# Patient Record
Sex: Male | Born: 1960 | Race: White | Hispanic: No | Marital: Married | Smoking: Current every day smoker
Health system: Southern US, Community
[De-identification: ages and names within clinical notes are randomized; demographics above are authoritative.]

## PROBLEM LIST (undated history)

## (undated) DIAGNOSIS — Z720 Tobacco use: Secondary | ICD-10-CM

## (undated) DIAGNOSIS — I10 Essential (primary) hypertension: Secondary | ICD-10-CM

## (undated) DIAGNOSIS — F1090 Alcohol use, unspecified, uncomplicated: Secondary | ICD-10-CM

## (undated) DIAGNOSIS — I2510 Atherosclerotic heart disease of native coronary artery without angina pectoris: Secondary | ICD-10-CM

## (undated) DIAGNOSIS — R0600 Dyspnea, unspecified: Secondary | ICD-10-CM

## (undated) DIAGNOSIS — Z789 Other specified health status: Secondary | ICD-10-CM

## (undated) DIAGNOSIS — Z87442 Personal history of urinary calculi: Secondary | ICD-10-CM

## (undated) DIAGNOSIS — R7303 Prediabetes: Secondary | ICD-10-CM

## (undated) DIAGNOSIS — Z9289 Personal history of other medical treatment: Secondary | ICD-10-CM

## (undated) DIAGNOSIS — Z7289 Other problems related to lifestyle: Secondary | ICD-10-CM

## (undated) HISTORY — PX: TONSILLECTOMY: SUR1361

---

## 2014-03-11 ENCOUNTER — Emergency Department (HOSPITAL_BASED_OUTPATIENT_CLINIC_OR_DEPARTMENT_OTHER)
Admission: EM | Admit: 2014-03-11 | Discharge: 2014-03-11 | Disposition: A | Discharge status: Home / Self Care | Payer: 59 | Attending: Emergency Medicine | Admitting: Emergency Medicine

## 2014-03-11 ENCOUNTER — Emergency Department (HOSPITAL_BASED_OUTPATIENT_CLINIC_OR_DEPARTMENT_OTHER): Payer: 59

## 2014-03-11 ENCOUNTER — Encounter (HOSPITAL_BASED_OUTPATIENT_CLINIC_OR_DEPARTMENT_OTHER): Payer: Self-pay | Admitting: Emergency Medicine

## 2014-03-11 DIAGNOSIS — F17200 Nicotine dependence, unspecified, uncomplicated: Secondary | ICD-10-CM | POA: Insufficient documentation

## 2014-03-11 DIAGNOSIS — R079 Chest pain, unspecified: Secondary | ICD-10-CM

## 2014-03-11 DIAGNOSIS — K219 Gastro-esophageal reflux disease without esophagitis: Secondary | ICD-10-CM | POA: Insufficient documentation

## 2014-03-11 DIAGNOSIS — IMO0001 Reserved for inherently not codable concepts without codable children: Secondary | ICD-10-CM

## 2014-03-11 DIAGNOSIS — R0602 Shortness of breath: Secondary | ICD-10-CM | POA: Insufficient documentation

## 2014-03-11 DIAGNOSIS — I10 Essential (primary) hypertension: Secondary | ICD-10-CM | POA: Insufficient documentation

## 2014-03-11 HISTORY — DX: Essential (primary) hypertension: I10

## 2014-03-11 LAB — CBC
HCT: 46.1 % (ref 39.0–52.0)
HEMOGLOBIN: 16.2 g/dL (ref 13.0–17.0)
MCH: 35.4 pg — ABNORMAL HIGH (ref 26.0–34.0)
MCHC: 35.1 g/dL (ref 30.0–36.0)
MCV: 100.9 fL — AB (ref 78.0–100.0)
Platelets: 291 10*3/uL (ref 150–400)
RBC: 4.57 MIL/uL (ref 4.22–5.81)
RDW: 13.2 % (ref 11.5–15.5)
WBC: 13.6 10*3/uL — AB (ref 4.0–10.5)

## 2014-03-11 LAB — D-DIMER, QUANTITATIVE (NOT AT ARMC)

## 2014-03-11 LAB — COMPREHENSIVE METABOLIC PANEL
ALK PHOS: 181 U/L — AB (ref 39–117)
ALT: 121 U/L — ABNORMAL HIGH (ref 0–53)
AST: 107 U/L — ABNORMAL HIGH (ref 0–37)
Albumin: 3.9 g/dL (ref 3.5–5.2)
BILIRUBIN TOTAL: 0.6 mg/dL (ref 0.3–1.2)
BUN: 10 mg/dL (ref 6–23)
CHLORIDE: 102 meq/L (ref 96–112)
CO2: 24 meq/L (ref 19–32)
CREATININE: 1 mg/dL (ref 0.50–1.35)
Calcium: 10 mg/dL (ref 8.4–10.5)
GFR, EST NON AFRICAN AMERICAN: 86 mL/min — AB (ref >90)
GLUCOSE: 131 mg/dL — AB (ref 70–99)
POTASSIUM: 4.1 meq/L (ref 3.7–5.3)
Sodium: 142 mEq/L (ref 137–147)
Total Protein: 7.9 g/dL (ref 6.0–8.3)

## 2014-03-11 LAB — PRO B NATRIURETIC PEPTIDE: Pro B Natriuretic peptide (BNP): 35.3 pg/mL (ref 0–125)

## 2014-03-11 LAB — TROPONIN I

## 2014-03-11 MED ORDER — AMLODIPINE BESYLATE 10 MG PO TABS
10.0000 mg | ORAL_TABLET | Freq: Every day | ORAL | Status: DC
Start: 2014-03-11 — End: 2018-11-15

## 2014-03-11 MED ORDER — LANSOPRAZOLE 30 MG PO CPDR: 30.0000 mg | DELAYED_RELEASE_CAPSULE | Freq: Every day | ORAL | Status: DC

## 2014-03-11 NOTE — Discharge Instructions (Signed)
Chest Pain (Nonspecific) °It is often hard to give a specific diagnosis for the cause of chest pain. There is always a chance that your pain could be related to something serious, such as a heart attack or a blood clot in the lungs. You need to follow up with your caregiver for further evaluation. °CAUSES  °· Heartburn. °· Pneumonia or bronchitis. °· Anxiety or stress. °· Inflammation around your heart (pericarditis) or lung (pleuritis or pleurisy). °· A blood clot in the lung. °· A collapsed lung (pneumothorax). It can develop suddenly on its own (spontaneous pneumothorax) or from injury (trauma) to the chest. °· Shingles infection (herpes zoster virus). °The chest wall is composed of bones, muscles, and cartilage. Any of these can be the source of the pain. °· The bones can be bruised by injury. °· The muscles or cartilage can be strained by coughing or overwork. °· The cartilage can be affected by inflammation and become sore (costochondritis). °DIAGNOSIS  °Lab tests or other studies, such as X-rays, electrocardiography, stress testing, or cardiac imaging, may be needed to find the cause of your pain.  °TREATMENT  °· Treatment depends on what may be causing your chest pain. Treatment may include: °· Acid blockers for heartburn. °· Anti-inflammatory medicine. °· Pain medicine for inflammatory conditions. °· Antibiotics if an infection is present. °· You may be advised to change lifestyle habits. This includes stopping smoking and avoiding alcohol, caffeine, and chocolate. °· You may be advised to keep your head raised (elevated) when sleeping. This reduces the chance of acid going backward from your stomach into your esophagus. °· Most of the time, nonspecific chest pain will improve within 2 to 3 days with rest and mild pain medicine. °HOME CARE INSTRUCTIONS  °· If antibiotics were prescribed, take your antibiotics as directed. Finish them even if you start to feel better. °· For the next few days, avoid physical  activities that bring on chest pain. Continue physical activities as directed. °· Do not smoke. °· Avoid drinking alcohol. °· Only take over-the-counter or prescription medicine for pain, discomfort, or fever as directed by your caregiver. °· Follow your caregiver's suggestions for further testing if your chest pain does not go away. °· Keep any follow-up appointments you made. If you do not go to an appointment, you could develop lasting (chronic) problems with pain. If there is any problem keeping an appointment, you must call to reschedule. °SEEK MEDICAL CARE IF:  °· You think you are having problems from the medicine you are taking. Read your medicine instructions carefully. °· Your chest pain does not go away, even after treatment. °· You develop a rash with blisters on your chest. °SEEK IMMEDIATE MEDICAL CARE IF:  °· You have increased chest pain or pain that spreads to your arm, neck, jaw, back, or abdomen. °· You develop shortness of breath, an increasing cough, or you are coughing up blood. °· You have severe back or abdominal pain, feel nauseous, or vomit. °· You develop severe weakness, fainting, or chills. °· You have a fever. °THIS IS AN EMERGENCY. Do not wait to see if the pain will go away. Get medical help at once. Call your local emergency services (911 in [place] Do not drive yourself to the hospital. °MAKE SURE YOU:  °· Understand these instructions. °· Will watch your condition. °· Will get help right away if you are not doing well or get worse. °Document Released: 07/24/2005 Document Revised: 01/06/2012 Document Reviewed: 05/19/2008 °ExitCare® Patient Information ©2014 [employer],   LLC. ° °Gastroesophageal Reflux Disease, Adult °Gastroesophageal reflux disease (GERD) happens when acid from your stomach flows up into the esophagus. When acid comes in contact with the esophagus, the acid causes soreness (inflammation) in the esophagus. Over time, GERD may create small holes (ulcers) in the lining of  the esophagus. °CAUSES  °· Increased body weight. This puts pressure on the stomach, making acid rise from the stomach into the esophagus. °· Smoking. This increases acid production in the stomach. °· Drinking alcohol. This causes decreased pressure in the lower esophageal sphincter (valve or ring of muscle between the esophagus and stomach), allowing acid from the stomach into the esophagus. °· Late evening meals and a full stomach. This increases pressure and acid production in the stomach. °· A malformed lower esophageal sphincter. °Sometimes, no cause is found. °SYMPTOMS  °· Burning pain in the lower part of the mid-chest behind the breastbone and in the mid-stomach area. This may occur twice a week or more often. °· Trouble swallowing. °· Sore throat. °· Dry cough. °· Asthma-like symptoms including chest tightness, shortness of breath, or wheezing. °DIAGNOSIS  °Your caregiver may be able to diagnose GERD based on your symptoms. In some cases, X-rays and other tests may be done to check for complications or to check the condition of your stomach and esophagus. °TREATMENT  °Your caregiver may recommend over-the-counter or prescription medicines to help decrease acid production. Ask your caregiver before starting or adding any new medicines.  °HOME CARE INSTRUCTIONS  °· Change the factors that you can control. Ask your caregiver for guidance concerning weight loss, quitting smoking, and alcohol consumption. °· Avoid foods and drinks that make your symptoms worse, such as: °· Caffeine or alcoholic drinks. °· Chocolate. °· Peppermint or mint flavorings. °· Garlic and onions. °· Spicy foods. °· Citrus fruits, such as oranges, lemons, or limes. °· Tomato-based foods such as sauce, chili, salsa, and pizza. °· Fried and fatty foods. °· Avoid lying down for the 3 hours prior to your bedtime or prior to taking a nap. °· Eat small, frequent meals instead of large meals. °· Wear loose-fitting clothing. Do not wear anything  tight around your waist that causes pressure on your stomach. °· Raise the head of your bed 6 to 8 inches with wood blocks to help you sleep. Extra pillows will not help. °· Only take over-the-counter or prescription medicines for pain, discomfort, or fever as directed by your caregiver. °· Do not take aspirin, ibuprofen, or other nonsteroidal anti-inflammatory drugs (NSAIDs). °SEEK IMMEDIATE MEDICAL CARE IF:  °· You have pain in your arms, neck, jaw, teeth, or back. °· Your pain increases or changes in intensity or duration. °· You develop nausea, vomiting, or sweating (diaphoresis). °· You develop shortness of breath, or you faint. °· Your vomit is green, yellow, black, or looks like coffee grounds or blood. °· Your stool is red, bloody, or black. °These symptoms could be signs of other problems, such as heart disease, gastric bleeding, or esophageal bleeding. °MAKE SURE YOU:  °· Understand these instructions. °· Will watch your condition. °· Will get help right away if you are not doing well or get worse. °Document Released: 07/24/2005 Document Revised: 01/06/2012 Document Reviewed: 05/03/2011 °ExitCare® Patient Information ©2014 ExitCare, LLC. °  Emergency Department Resource Guide °1) Find a Doctor and Pay Out of Pocket °Although you won't have to find out who is covered by your insurance plan, it is a good idea to ask around and get recommendations. You will   then need to call the office and see if the doctor you have chosen will accept you as a new patient and what types of options they offer for patients who are self-pay. Some doctors offer discounts or will set up payment plans for their patients who do not have insurance, but you will need to ask so you aren't surprised when you get to your appointment. ° °2) Contact Your Local Health Department °Not all health departments have doctors that can see patients for sick visits, but many do, so it is worth a call to see if yours does. If you don't know where  your local health department is, you can check in your phone book. The CDC also has a tool to help you locate your state's health department, and many state websites also have listings of all of their local health departments. ° °3) Find a Walk-in Clinic °If your illness is not likely to be very severe or complicated, you may want to try a walk in clinic. These are popping up all over the country in pharmacies, drugstores, and shopping centers. They're usually staffed by nurse practitioners or physician assistants that have been trained to treat common illnesses and complaints. They're usually fairly quick and inexpensive. However, if you have serious medical issues or chronic medical problems, these are probably not your best option. ° °No Primary Care Doctor: °- Call [facility] at  832-8000 - they can help you locate a primary care doctor that  accepts your insurance, provides certain services, etc. °- Physician Referral Service- 1-800-533-3463 ° °Chronic Pain Problems: °Organization         Address  Phone   Notes  °[facility]  (336) 297-2271 Patients need to be referred by their primary care doctor.  ° °Medication Assistance: °Organization         Address  Phone   Notes  °[place] [facility] [address] °[place], [place] [address] (336) 641-8030 --Must be a resident of [place] °-- Must have NO insurance coverage whatsoever (no Medicaid/ Medicare, etc.) °-- The pt. MUST have a primary care doctor that directs their care regularly and follows them in the community °  °[facility]  (866) 331-1348   °[employer]  (888) 892-1162   ° °Agencies that provide inexpensive medical care: °Organization         Address  Phone   Notes  °[facility]  (336) 832-8035   °[facility]    (336) 832-7272   °[facility] [address] °[place], [place] [address] (336) 832-4777   °[facility] [address], °[place] (336) 271-4999   °[facility]    (336) 373-0678   °[facility]    (336) 272-1050   °[facility] ° [address], [place] Phone:  (336) 832-4444, Fax:  (336) 832-4440 Hours of Operation:  9 am - 6 pm, M-F.  Also accepts Medicaid/Medicare and self-pay.  °[facility] ° [address], [place] Phone: (336) [phone], Fax: (336) [phone]. Hours of Operation:  8:30 am - 5:30 pm, M-F.  Also accepts Medicaid and self-pay.  °[facility] [place] [address], [place] Phone: (336) 878-6027   °[facility] [address], [place], [place] (336)723-1848, Ext. 123 Mondays & Thursdays: 7-9 AM.  First 15 patients are seen on a first come, first serve   basis. °  ° °Medicaid-accepting [place] Providers: ° °Organization         Address  Phone   Notes  °[facility] [address], [place] (336) [phone] Also accepts self-pay patients.  °[facility] [address], [place] ° (336) 856-9996   °[facility] [address], [place] (336) 288-8857   °[facility] [address] [place] [address], [place] (336) 299-7000   °Veita Bland [address], [place]  ° (336) [phone] Only accepts [place] Access Medicaid patients after they have their name applied to their card.  ° °Self-Pay (no insurance) in [place]: ° °Organization         Address  Phone   Notes  °Sickle Cell Patients, [facility] [address], [place] (336) 832-1970   °[facility] [address], [place] (336) 832-4400   °[facility] ° [address], [place] (336) 992-4800   °[facility]/Dr. Osei-Bonsu ° [address] [place] [address], [place] or [address], [place] (336) [phone] Phone number for both [place] and [place] locations is the same.  °[facility] [address], [place] (336) 299-0000   °[facility] [address] [place] [address], [place] or [address] (336) 852-7530 °(336) 878-2260   °[facility] [address], [place] (336) 350-1642, phone; (336) 294-5005, fax Sees patients 1st and 3rd Saturday of every month.  Must not qualify for public or private insurance (i.e. Medicaid, Medicare, [place] Health Choice, Veterans' Benefits) • Household income should be no more than 200% of the poverty level •The clinic cannot treat you if you are pregnant or think you are pregnant • Sexually transmitted diseases are not treated at the clinic.  ° °Dental Care: °Organization         Address  Phone  Notes  °[facility] [address], [place] (336) 641-6152 Accepts children up to age 21 who are enrolled in Medicaid or [place] Health Choice; pregnant women with a Medicaid card; and children who have applied for Medicaid or [place] Health Choice, but were declined, whose parents can pay a reduced fee at time of service.  °[facility] [place]  [address], [place] (336) 641-7733 Accepts children up to age 21 who are enrolled in Medicaid or [place] Health Choice; pregnant women with a Medicaid card; and children who have applied for Medicaid or [place] Health Choice, but were declined, whose parents can pay a reduced fee at time of service.  °[facility] ° [address],  (336) 641-4533 Patients are seen by appointment only. Walk-ins are not accepted. [facility] will see patients 18 years of age and older. °Monday - Tuesday (8am-5pm) °Most Wednesdays (8:30-5pm) °$30 per visit, cash only  °[facility] ° [address], [place] (336) 641-4533 Patients are seen by appointment only. Walk-ins are not  accepted. [facility] will see patients 18 years of age and older. °One Wednesday Evening (Monthly: Volunteer Based).  $30 per visit, cash only  °[facility]  (919) [phone] for adults; Children under age 4, call Graduate Pediatric Dentistry at ([phone]) [phone]. Children aged 4-14, please call ([phone]) [phone]   to request a pediatric application. ° Dental services are provided in all areas of dental care including fillings, crowns and bridges, complete and partial dentures, implants, gum treatment, root canals, and extractions. Preventive care is also provided. Treatment is provided to both adults and children. °Patients are selected via a lottery and there is often a waiting list. °  °[facility] [address], °[place] ° (336) 763-8833 [email or web address] °  °[facility] [address], [place], [place] (336)723-1848, Ext. 123 Second and Fourth Thursday of each month, opens at 6:30 AM; Clinic ends at 9 AM.  Patients are seen on a first-come first-served basis, and a limited number are seen during each clinic.  ° °[facility] ° [address], [place], [place] (336) [phone]   Eligibility Requirements °You must have lived in [place], [place], or [place] counties for at least the last three months. °  You cannot be eligible for state or federal sponsored healthcare insurance, including Veterans Administration, Medicaid, or Medicare. °  You generally cannot be eligible for healthcare insurance through your employer.  °  How to apply: °Eligibility screenings are held every Tuesday and Wednesday afternoon from 1:00 pm until 4:00 pm. You do not need an appointment for the interview!  °[facility] [address], [place], [place] 336-631-2330   °[facility]  336-342-8273   °[facility]  336-703-3100   °[facility]  336-570-6415   ° °Behavioral Health Resources in the  Community: °Intensive Outpatient Programs °Organization         Address  Phone  Notes  °[facility] [address], [place], [place] 336-878-6098   °[facility] [address], [place], [place] 336-832-9800   °[facility] [address], [place], [place] ° 336-882-2125   °[facility] [address],  °[place], [place] 1-800-853-5163 or 336-641-4981   °Substance Abuse Resources °Organization         Address  Phone  Notes  °[facility]  336-882-2125   °[facility]  336-784-9470   °[facility]  336-285-9073   °[facility]  336-845-3988   °Residential & Outpatient Substance Abuse Program  1-800-659-3381   °Psychological Services °Organization         Address  Phone  Notes  °[facility]  336- 832-9600   °[facility]  336- 378-7881   °[facility] [address], [place] 1-800-853-5163 or 336-641-4981   ° °Mobile Crisis Teams °Organization         Address  Phone  Notes  °[facility], Mobile Crisis Care Unit  1-877-626-1772   °Assertive °[facility] ° [address] [place], [place] 336-834-9664   °Sharon DeEsch [address] °[place] 336-554-5454   ° °Self-Help/Support Groups °Organization         Address  Phone             Notes  °[facility] - variety of support groups  336- 373-1402 Call for more information  °Narcotics Anonymous (NA), [facility] [address], °[place]  2 meetings at this location  ° °Residential Treatment Programs °Organization         Address  Phone  Notes  °[facility] [address],    °[place]  1-866-801-8205   °[facility] ° [address], [place], [place] 704-293-8524   °[facility] [address], [place] 336-845-3988 Admissions: 8am-3pm M-F  °[facility] [address]    °[place], [place] 336-841-1104   °[facility] [address], [place], [place] 336-379-7146   °[facility] [address]  °[place], [place] 336-285-9073   °[facility] - Intensive Outpatient [address], [place], [place] 336-852-3033   °[facility] [address]  °[place], [place] 1-877-615-2722 or 336-784-9470   °[facility] [address] [place], [place] 336-227-7417 Accepts Medicaid  °[facility] [address]  °[place] 1-800-659-3381 Substance Abuse/Addiction Treatment  ° °[place] Behavioral Health Resources °Organization         Address  Phone  Notes  °[facility]  (888) 581-9988   °Julie Brannon, PhD [address] [place], [place]   (336) 349-5553 or (336) 951-0000   °[facility]   [address] °[place], [place] (336) 349-4454   °[facility] [address], [place], [place] (336) 342-8316 Insurance/Medicaid/sponsorship through [facility]  °[facility] [address]                                    [place], [place] (336) 342-8316 Therapy/tele-psych/case  °[facility] [address]  ° [place],  (336) 349-2233    °Dr. Arfeen  (336) 349-4544   °[facility] [place]  [employer] [place] [facility] 1) [address], [place] °2) [address], [place] °3)  [address], [place] (336) 349-3220 °(336) 342-7768 ° °(336) 342-8140   °[place] [facility] (336) 342-1394 or (336) 342-3537 (After Hours)    ° °   °

## 2014-03-11 NOTE — ED Provider Notes (Signed)
CSN: [id number]     Arrival date & time 03/11/14  1743 History   First MD Initiated Contact with Patient 03/11/14 1807     Chief Complaint  Patient presents with  . Fatigue     (Consider location/radiation/quality/duration/timing/severity/associated sxs/prior Treatment) Patient is a 53 y.o. male presenting with chest pain. The history is provided by the patient. No language interpreter was used.  Chest Pain Pain location:  L chest Pain quality: aching   Pain radiates to:  L arm Pain radiates to the back: no   Pain severity:  Moderate Onset quality:  Gradual Duration:  3 days Timing:  Intermittent Progression:  Worsening Chronicity:  New Context: breathing   Relieved by:  Nothing Worsened by:  Nothing tried Ineffective treatments:  None tried Associated symptoms: shortness of breath     Past Medical History  Diagnosis Date  . Hypertension    Past Surgical History  Procedure Laterality Date  . Tonsillectomy     History reviewed. No pertinent family history. History  Substance Use Topics  . Smoking status: Current Every Day Smoker -- 1.00 packs/day    Types: Cigarettes  . Smokeless tobacco: Not on file  . Alcohol Use: No    Review of Systems  Respiratory: Positive for shortness of breath.   Cardiovascular: Positive for chest pain.  All other systems reviewed and are negative.     Allergies  Review of patient's allergies indicates no known allergies.  Home Medications   Prior to Admission medications   Not on File   BP 183/113  Pulse 105  Temp(Src) 98.3 F (36.8 C) (Oral)  Resp 18  Ht 5\' 9"  (1.753 m)  Wt 240 lb (108.863 kg)  BMI 35.43 kg/m2  SpO2 99% Physical Exam  Nursing note and vitals reviewed. Constitutional: He appears well-developed and well-nourished.  HENT:  Head: Normocephalic.  Eyes: Conjunctivae and EOM are normal. Pupils are equal, round, and reactive to light.  Neck: Normal range of motion. Neck supple.  Cardiovascular: Normal  rate and regular rhythm.   Pulmonary/Chest: Effort normal and breath sounds normal.  Abdominal: Soft. Bowel sounds are normal.  Neurological: He is alert.  Skin: Skin is warm.  Psychiatric: He has a normal mood and affect.    ED Course  Procedures (including critical care time) Labs Review Labs Reviewed  CBC  COMPREHENSIVE METABOLIC PANEL  TROPONIN I    Imaging Review No results found.   EKG Interpretation None      Results for orders placed during the hospital encounter of 03/11/14  CBC      Result Value Ref Range   WBC 13.6 (*) 4.0 - 10.5 K/uL   RBC 4.57  4.22 - 5.81 MIL/uL   Hemoglobin 16.2  13.0 - 17.0 g/dL   HCT 40.946.1  81.139.0 - 91.452.0 %   MCV 100.9 (*) 78.0 - 100.0 fL   MCH 35.4 (*) 26.0 - 34.0 pg   MCHC 35.1  30.0 - 36.0 g/dL   RDW 78.213.2  95.611.5 - 21.315.5 %   Platelets 291  150 - 400 K/uL  COMPREHENSIVE METABOLIC PANEL      Result Value Ref Range   Sodium 142  137 - 147 mEq/L   Potassium 4.1  3.7 - 5.3 mEq/L   Chloride 102  96 - 112 mEq/L   CO2 24  19 - 32 mEq/L   Glucose, Bld 131 (*) 70 - 99 mg/dL   BUN 10  6 - 23 mg/dL   Creatinine, Ser  1.00  0.50 - 1.35 mg/dL   Calcium 40.910.0  8.4 - 81.110.5 mg/dL   Total Protein 7.9  6.0 - 8.3 g/dL   Albumin 3.9  3.5 - 5.2 g/dL   AST 914107 (*) 0 - 37 U/L   ALT 121 (*) 0 - 53 U/L   Alkaline Phosphatase 181 (*) 39 - 117 U/L   Total Bilirubin 0.6  0.3 - 1.2 mg/dL   GFR calc non Af Amer 86 (*) >90 mL/min   GFR calc Af Amer >90  >90 mL/min  TROPONIN I      Result Value Ref Range   Troponin I <0.30  <0.30 ng/mL  D-DIMER, QUANTITATIVE      Result Value Ref Range   D-Dimer, Quant <0.27  0.00 - 0.48 ug/mL-FEU  PRO B NATRIURETIC PEPTIDE      Result Value Ref Range   Pro B Natriuretic peptide (BNP) 35.3  0 - 125 pg/mL   Dg Chest 2 View  03/11/2014   CLINICAL DATA:  Chest pain and shortness of breath with generalize weakness  EXAM: CHEST  2 VIEW  COMPARISON:  None.  FINDINGS: The lungs are adequately inflated. There is mild prominence of  the pulmonary interstitial markings especially in the perihilar regions. The cardiac silhouette is normal in size. The mediastinum is normal in width. The pulmonary vascularity is prominent centrally. There is no pleural effusion or pneumothorax. The trachea is midline. The observed portions of the bony thorax appear normal.  IMPRESSION: There is mild prominence of the perihilar lung markings at of the central pulmonary vascularity. This may reflect mild pulmonary interstitial edema of cardiac or noncardiac cause. There is no enlargement of the cardiac silhouette however.   Electronically Signed   By: David  Swazil[place]Jordan   On: 03/11/2014 18:33     MDM   Final diagnoses:  Reflux  Chest pain    Labs normal.  Dr. Loretha StaplerWofford in to see and examine.   Pt given rx for norvasc and prevacid.   Pt given primary care refferal.   Pt advised he has to establish for on going care    Elson AreasLeslie K Simon Aaberg, PA-C 03/11/14 2155

## 2014-03-11 NOTE — ED Notes (Signed)
Pt reports Chest pain x 3 days ago , denies CP, SOB now, c/o generalized weakness and fatigue x 3 days

## 2014-03-13 NOTE — ED Provider Notes (Signed)
Medical screening examination/treatment/procedure(s) were conducted as a shared visit with non-physician practitioner(s) and myself.  I personally evaluated the patient during the encounter.   EKG Interpretation   Date/Time:  Friday Mar 11 2014 18:09:35 EDT Ventricular Rate:  99 PR Interval:  168 QRS Duration: 72 QT Interval:  340 QTC Calculation: 436 R Axis:   0 Text Interpretation:  Normal sinus rhythm Possible Left atrial enlargement  Left ventricular hypertrophy Abnormal ECG No old tracing to compare  Confirmed by [facility]  MD, TREY ([id number]) on 03/11/2014 8:09:2842 PM      53 year old male presenting with chief complaint of a severe episode of shortness of breath 2 nights ago. Today, he had fatigue and headache, which she subsequently attributed to his moderate hypertension. He reports that the symptoms are recurrent for him. On exam, well-appearing, nontoxic, no stridor, no pharyngeal edema or erythema, heart sounds normal with regular rate and rhythm, lungs clear to auscultation bilaterally, abdomen soft and nontender.  Patient described the episode of shortness of breath as occurring after he had a foul taste in his mouth and coughed.  He may have suffered some reflux which caused his coughing fit and subsequent difficulty breathing. This episode of short-lived and has not recurred in the interim. ED workup is reassuring. However, I strongly encouraged patient to followup with his primary doctor for further evaluation.  Clinical Impression: 1. Reflux   2. Chest pain       Candyce ChurnJohn David Daulton Harbaugh III, MD 03/13/14 [phone]

## 2014-03-13 NOTE — ED Provider Notes (Signed)
Medical screening examination/treatment/procedure(s) were conducted as a shared visit with non-physician practitioner(s) and myself.  I personally evaluated the patient during the encounter.   EKG Interpretation   Date/Time:  Friday Mar 11 2014 18:09:35 EDT Ventricular Rate:  99 PR Interval:  168 QRS Duration: 72 QT Interval:  340 QTC Calculation: 436 R Axis:   0 Text Interpretation:  Normal sinus rhythm Possible Left atrial enlargement  Left ventricular hypertrophy Abnormal ECG No old tracing to compare  Confirmed by [facility]  [facility], TREY [phone]([id number]) on 03/11/2014 8:09:42 PM        [name], MD 03/13/14 [phone]

## 2018-11-13 ENCOUNTER — Observation Stay (HOSPITAL_COMMUNITY)
Admission: EM | Admit: 2018-11-13 | Discharge: 2018-11-15 | Disposition: A | Discharge status: Home / Self Care | Payer: 59 | Attending: Cardiology | Admitting: Cardiology

## 2018-11-13 ENCOUNTER — Encounter: Payer: Self-pay | Admitting: Nurse Practitioner

## 2018-11-13 ENCOUNTER — Emergency Department (HOSPITAL_COMMUNITY): Payer: 59

## 2018-11-13 DIAGNOSIS — F17210 Nicotine dependence, cigarettes, uncomplicated: Secondary | ICD-10-CM | POA: Insufficient documentation

## 2018-11-13 DIAGNOSIS — I2510 Atherosclerotic heart disease of native coronary artery without angina pectoris: Secondary | ICD-10-CM | POA: Diagnosis not present

## 2018-11-13 DIAGNOSIS — R7303 Prediabetes: Secondary | ICD-10-CM | POA: Insufficient documentation

## 2018-11-13 DIAGNOSIS — I10 Essential (primary) hypertension: Secondary | ICD-10-CM | POA: Insufficient documentation

## 2018-11-13 DIAGNOSIS — Z79899 Other long term (current) drug therapy: Secondary | ICD-10-CM | POA: Insufficient documentation

## 2018-11-13 DIAGNOSIS — Z720 Tobacco use: Secondary | ICD-10-CM

## 2018-11-13 DIAGNOSIS — I214 Non-ST elevation (NSTEMI) myocardial infarction: Secondary | ICD-10-CM | POA: Diagnosis not present

## 2018-11-13 DIAGNOSIS — Z8249 Family history of ischemic heart disease and other diseases of the circulatory system: Secondary | ICD-10-CM | POA: Insufficient documentation

## 2018-11-13 DIAGNOSIS — Z7982 Long term (current) use of aspirin: Secondary | ICD-10-CM | POA: Insufficient documentation

## 2018-11-13 HISTORY — DX: Other specified health status: Z78.9

## 2018-11-13 HISTORY — DX: Tobacco use: Z72.0

## 2018-11-13 HISTORY — DX: Alcohol use, unspecified, uncomplicated: F10.90

## 2018-11-13 HISTORY — DX: Prediabetes: R73.03

## 2018-11-13 HISTORY — DX: Personal history of other medical treatment: Z92.89

## 2018-11-13 HISTORY — DX: Morbid (severe) obesity due to excess calories: E66.01

## 2018-11-13 HISTORY — DX: Atherosclerotic heart disease of native coronary artery without angina pectoris: I25.10

## 2018-11-13 HISTORY — DX: Other problems related to lifestyle: Z72.89

## 2018-11-13 LAB — CBC
HCT: 45.4 % (ref 39.0–52.0)
Hemoglobin: 14.5 g/dL (ref 13.0–17.0)
MCH: 31.8 pg (ref 26.0–34.0)
MCHC: 31.9 g/dL (ref 30.0–36.0)
MCV: 99.6 fL (ref 80.0–100.0)
NRBC: 0 % (ref 0.0–0.2)
Platelets: 269 10*3/uL (ref 150–400)
RBC: 4.56 MIL/uL (ref 4.22–5.81)
RDW: 12.7 % (ref 11.5–15.5)
WBC: 11.8 10*3/uL — ABNORMAL HIGH (ref 4.0–10.5)

## 2018-11-13 LAB — BASIC METABOLIC PANEL
ANION GAP: 10 (ref 5–15)
BUN: 7 mg/dL (ref 6–20)
CALCIUM: 9 mg/dL (ref 8.9–10.3)
CO2: 22 mmol/L (ref 22–32)
CREATININE: 1.17 mg/dL (ref 0.61–1.24)
Chloride: 107 mmol/L (ref 98–111)
GFR calc non Af Amer: >60 mL/min (ref >60)
Glucose, Bld: 129 mg/dL — ABNORMAL HIGH (ref 70–99)
Potassium: 3.6 mmol/L (ref 3.5–5.1)
Sodium: 139 mmol/L (ref 135–145)

## 2018-11-13 LAB — I-STAT TROPONIN, ED: TROPONIN I, POC: 0.5 ng/mL — AB (ref 0.00–0.08)

## 2018-11-13 LAB — TROPONIN I: Troponin I: 0.52 ng/mL (ref <0.03)

## 2018-11-13 MED ORDER — GENERIC EXTERNAL MEDICATION
Status: DC
Start: ? — End: 2018-11-13

## 2018-11-13 MED ORDER — PANTOPRAZOLE SODIUM 40 MG PO TBEC
40.0000 mg | DELAYED_RELEASE_TABLET | Freq: Every day | ORAL | Status: DC
  Administered 2018-11-14 – 2018-11-15 (×2): 40 mg via ORAL
  Filled 2018-11-13 (×2): qty 1

## 2018-11-13 MED ORDER — NICOTINE 14 MG/24HR TD PT24
1.00 | MEDICATED_PATCH | TRANSDERMAL | Status: DC
Start: 2018-11-12 — End: 2018-11-13

## 2018-11-13 MED ORDER — GENERIC EXTERNAL MEDICATION
40.00 | Status: DC
Start: ? — End: 2018-11-13

## 2018-11-13 MED ORDER — ONDANSETRON HCL 4 MG/2ML IJ SOLN: 4.0000 mg | Freq: Four times a day (QID) | INTRAMUSCULAR | Status: DC | PRN

## 2018-11-13 MED ORDER — ASPIRIN EC 81 MG PO TBEC
81.0000 mg | DELAYED_RELEASE_TABLET | Freq: Every day | ORAL | Status: DC
  Administered 2018-11-14 – 2018-11-15 (×2): 81 mg via ORAL
  Filled 2018-11-13 (×2): qty 1

## 2018-11-13 MED ORDER — NITROGLYCERIN 0.4 MG SL SUBL
0.40 | SUBLINGUAL_TABLET | SUBLINGUAL | Status: DC
Start: ? — End: 2018-11-13

## 2018-11-13 MED ORDER — SODIUM CHLORIDE 0.9 % IV SOLN
INTRAVENOUS | Status: DC
Start: ? — End: 2018-11-13

## 2018-11-13 MED ORDER — CARVEDILOL 3.125 MG PO TABS
3.1250 mg | ORAL_TABLET | Freq: Two times a day (BID) | ORAL | Status: DC
  Administered 2018-11-14 – 2018-11-15 (×3): 3.125 mg via ORAL
  Filled 2018-11-13 (×4): qty 1

## 2018-11-13 MED ORDER — ACETAMINOPHEN 325 MG PO TABS: 650.0000 mg | ORAL_TABLET | ORAL | Status: DC | PRN

## 2018-11-13 MED ORDER — FENTANYL CITRATE (PF) 50 MCG/ML IJ SOLN
INTRAMUSCULAR | Status: DC
Start: ? — End: 2018-11-13

## 2018-11-13 MED ORDER — ASPIRIN EC 325 MG PO TBEC
325.00 | DELAYED_RELEASE_TABLET | ORAL | Status: DC
Start: 2018-11-12 — End: 2018-11-13

## 2018-11-13 MED ORDER — ATORVASTATIN CALCIUM 40 MG PO TABS
80.00 | ORAL_TABLET | ORAL | Status: DC
Start: 2018-11-12 — End: 2018-11-13

## 2018-11-13 MED ORDER — HEPARIN SODIUM (PORCINE) 1000 UNIT/ML IJ SOLN
INTRAMUSCULAR | Status: DC
Start: ? — End: 2018-11-13

## 2018-11-13 MED ORDER — METOPROLOL TARTRATE 50 MG PO TABS
50.00 | ORAL_TABLET | ORAL | Status: DC
Start: 2018-11-12 — End: 2018-11-13

## 2018-11-13 MED ORDER — HEPARIN (PORCINE) IN NACL 25000-0.45 UT/250ML-% IV SOLN
21.50 | INTRAVENOUS | Status: DC
Start: ? — End: 2018-11-13

## 2018-11-13 MED ORDER — ENOXAPARIN SODIUM 120 MG/0.8ML ~~LOC~~ SOLN
110.0000 mg | Freq: Two times a day (BID) | SUBCUTANEOUS | Status: DC
  Administered 2018-11-13 – 2018-11-15 (×4): 110 mg via SUBCUTANEOUS
  Filled 2018-11-13 (×7): qty 0.73

## 2018-11-13 MED ORDER — NITROGLYCERIN 0.4 MG SL SUBL: 0.4000 mg | SUBLINGUAL_TABLET | SUBLINGUAL | Status: DC | PRN

## 2018-11-13 MED ORDER — LIDOCAINE HCL 1 % IJ SOLN
INTRAMUSCULAR | Status: DC
Start: ? — End: 2018-11-13

## 2018-11-13 MED ORDER — ATORVASTATIN CALCIUM 80 MG PO TABS
80.0000 mg | ORAL_TABLET | Freq: Every day | ORAL | Status: DC
  Administered 2018-11-13 – 2018-11-14 (×2): 80 mg via ORAL
  Filled 2018-11-13 (×2): qty 1

## 2018-11-13 MED ORDER — SODIUM CHLORIDE 0.9% FLUSH
3.0000 mL | Freq: Once | INTRAVENOUS | Status: AC
  Administered 2018-11-13: 3 mL via INTRAVENOUS

## 2018-11-13 MED ORDER — NITROGLYCERIN 2 % TD OINT
1.0000 [in_us] | TOPICAL_OINTMENT | Freq: Four times a day (QID) | TRANSDERMAL | Status: DC
  Administered 2018-11-13 – 2018-11-15 (×7): 1 [in_us] via TOPICAL
  Filled 2018-11-13: qty 30

## 2018-11-13 NOTE — ED Notes (Signed)
Attempted report 

## 2018-11-13 NOTE — ED Triage Notes (Signed)
Pt here from home with c/o possible bypass surgery , pt was told in a hospital in [place] that he needed bypass surgery , pt flew home to have surgery here

## 2018-11-13 NOTE — ED Provider Notes (Signed)
[facility] Provider Note   CSN: [id number] Arrival date & time: 11/13/18  1746     History   Chief Complaint Chief Complaint  Patient presents with  . Chest Pain    HPI Gabriel Green is a 58 y.o. male who presents with chest pain. PMH significant for hx of NSTEMI, CAD, obesity, tobacco abuse, GERD, kidney stones, fatty liver. He states that he's been having "episodes" for the past couple months where he will have severe left sided chest pain which radiates to the left arm. He travels for work and was in [place] and reports he was having the episodes more frequently. His boss new about it and urged him to have it checked out. At the hospital he was admitted for NSTEMI. He had a carotid doppler, echo with preserved EF, and cath done. He was found to have multi-vessel disease and CABG was recommended. He didn't want to stay because he states "my wife would kill me" and he [place] He left the hospital AMA yesterday and flew home today. He called Dr. Riley KillStuckey with cardiology who referred him to the ED here. He reports some mild chest pressure currently but otherwise is asymptomatic. His cath showed:   CONCLUSION: 1. Significant multivessel coronary artery disease as described above  including an 80% stenotic lesion in the proximal LAD, 80% stenotic lesion  in the proximal left circumflex, and 90% stenotic lesion in the mid RCA 2. Hypertension 3. Nicotine abuse  HPI  Past Medical History:  Diagnosis Date  . Borderline diabetes    a. 10/2018 HbA1c = 6.3.  . CAD (coronary artery disease)    a. 11/10/2018 NSTEMI/Cath [facility] [place]): LM 70ost (dampening), LAD 70-80p, D1 20-30, LCX 70-80p, RCA 322m w/ L->R collats, RPDA/RPL min irregs.  Marland Kitchen. ETOH abuse   . History of echocardiogram    a. 11/10/2017 Echo: Nl LV fxn. Triv MR/TR.  Marland Kitchen. Hypertension   . Morbid obesity (HCC)   . Tobacco abuse      There are no active problems to display for this patient.   Past Surgical History:  Procedure Laterality Date  . TONSILLECTOMY          Home Medications    Prior to Admission medications   Medication Sig Start Date End Date Taking? Authorizing Provider  amLODipine (NORVASC) 10 MG tablet Take 1 tablet (10 mg total) by mouth daily. 03/11/14   Elson AreasSofia, Leslie K, PA-C  lansoprazole (PREVACID) 30 MG capsule Take 1 capsule (30 mg total) by mouth daily at 12 noon. 03/11/14   Elson AreasSofia, Leslie K, PA-C    Family History No family history on file.  Social History Social History   Tobacco Use  . Smoking status: Current Every Day Smoker    Packs/day: 1.00    Types: Cigarettes  Substance Use Topics  . Alcohol use: No  . Drug use: No     Allergies   Patient has no known allergies.   Review of Systems Review of Systems  Constitutional: Positive for fatigue.  Respiratory: Positive for shortness of breath.   Cardiovascular: Positive for chest pain. Negative for leg swelling.  Gastrointestinal: Negative for abdominal pain, nausea and vomiting.  Neurological: Negative for syncope and light-headedness.  All other systems reviewed and are negative.    Physical Exam Updated Vital Signs There were no vitals taken for this visit.  Physical Exam Vitals signs and nursing note reviewed.  Constitutional:      General: He is not in acute distress.    Appearance: He is well-developed. He is obese.     Comments: Calm and cooperative  HENT:     Head: Normocephalic and atraumatic.  Eyes:     General: No scleral icterus.       Right eye: No discharge.        Left eye: No discharge.     Conjunctiva/sclera: Conjunctivae normal.     Pupils: Pupils are equal, round, and reactive to light.  Neck:     Musculoskeletal: Normal range of motion.  Cardiovascular:     Rate and Rhythm: Normal rate and regular rhythm.     Heart sounds: Normal heart sounds.  Pulmonary:     Effort: Pulmonary  effort is normal. No respiratory distress.     Breath sounds: Normal breath sounds.  Abdominal:     General: There is no distension.  Musculoskeletal:     Right lower leg: No edema.     Left lower leg: No edema.  Skin:    General: Skin is warm and dry.  Neurological:     Mental Status: He is alert and oriented to person, place, and time.  Psychiatric:        Behavior: Behavior normal.      ED Treatments / Results  Labs (all labs ordered are listed, but only abnormal results are displayed) Labs Reviewed  BASIC METABOLIC PANEL - Abnormal; Notable for the following components:      Result Value   Glucose, Bld 129 (*)    All other components within normal limits  CBC - Abnormal; Notable for the following components:   WBC 11.8 (*)    All other components within normal limits  TROPONIN I - Abnormal; Notable for the following components:   Troponin I 0.52 (*)    All other components within normal limits  I-STAT TROPONIN, ED - Abnormal; Notable for the following components:   Troponin i, poc 0.50 (*)    All other components within normal limits  CBC  TROPONIN I  TROPONIN I  BASIC METABOLIC PANEL  CBC WITH DIFFERENTIAL/PLATELET    EKG EKG Interpretation  Date/Time:  Friday November 13 2018 18:44:19 EST Ventricular Rate:  102 PR Interval:  172 QRS Duration: 76 QT Interval:  342 QTC Calculation: 445 R Axis:   1 Text Interpretation:  Sinus tachycardia Minimal voltage criteria for LVH, may be normal variant Possible Lateral infarct , age undetermined Inferior infarct , age undetermined Abnormal ECG Since last tracing rate faster t ave changes diffusely new since last tracing Confirmed by Linwood DibblesKnapp, Jon [phone]([id number]) on 11/13/2018 6:55:55 PM   Radiology Dg Chest 2 View  Result Date: 11/13/2018 CLINICAL DATA:  Cardiac episode today EXAM: CHEST - 2 VIEW COMPARISON:  Mar 11, 2014 FINDINGS: The heart size and mediastinal contours are within normal limits. Both lungs are clear. The  visualized skeletal structures are unremarkable. IMPRESSION: No active cardiopulmonary disease. Electronically Signed   By: Sherian ReinWei-Chen  Lin M.D.   On: 11/13/2018 19:44    Procedures Procedures (including critical care time)  CRITICAL CARE Performed by: Bethel BornKelly Marie Wanita Derenzo   Total critical care time: 15 minutes  Critical care time was exclusive of separately billable procedures and treating other patients.  Critical care was necessary to treat or prevent imminent or life-threatening deterioration.  Critical care was time spent personally by me on the following activities: development of treatment plan with patient and/or surrogate as well  as nursing, discussions with consultants, evaluation of patient's response to treatment, examination of patient, obtaining history from patient or surrogate, ordering and performing treatments and interventions, ordering and review of laboratory studies, ordering and review of radiographic studies, pulse oximetry and re-evaluation of patient's condition.    Medications Ordered in ED Medications  aspirin EC tablet 81 mg (has no administration in time range)  nitroGLYCERIN (NITROSTAT) SL tablet 0.4 mg (has no administration in time range)  acetaminophen (TYLENOL) tablet 650 mg (has no administration in time range)  ondansetron (ZOFRAN) injection 4 mg (has no administration in time range)  carvedilol (COREG) tablet 3.125 mg (has no administration in time range)  atorvastatin (LIPITOR) tablet 80 mg (has no administration in time range)  pantoprazole (PROTONIX) EC tablet 40 mg (has no administration in time range)  nitroGLYCERIN (NITROGLYN) 2 % ointment 1 inch (has no administration in time range)  enoxaparin (LOVENOX) injection 110 mg (has no administration in time range)  sodium chloride flush (NS) 0.9 % injection 3 mL (3 mLs Intravenous Given 11/13/18 1959)     Initial Impression / Assessment and Plan / ED Course  I have reviewed the triage vital signs  and the nursing notes.  Pertinent labs & imaging results that were available during my care of the patient were reviewed by me and considered in my medical decision making (see chart for details).  58 year old male presents with chest pain.  EKG shows sinus tachycardia with diffuse T wave changes.  His troponin is elevated here to .5.  Labs are normal.  Chest x-ray is normal.  Cardiology is aware of the patient and are at bedside to admit.  Final Clinical Impressions(s) / ED Diagnoses   Final diagnoses:  NSTEMI (non-ST elevated myocardial infarction) [facility])    ED Discharge Orders    None       Bethel Born, PA-C 11/13/18 2348    Linwood Dibbles, MD 11/14/18 0003

## 2018-11-13 NOTE — H&P (Addendum)
History & Physical    Patient ID: Gabriel Green MRN: [id number], DOB/AGE: [birth year]   Admit date: 11/13/2018   Primary Physician: Patient, No Pcp Per Primary Cardiologist: Candee Furbish, MD - New  Patient Profile    58 y/o ? with a h/o HTN, tobacco abuse, and etoh abuse, who presented to the ED today after recent NSTEMI in [place] where he underwent cath and showed multivessel CAD with recommendation for CABG.  Past Medical History    Past Medical History:  Diagnosis Date  . Alcohol use   . Borderline diabetes    a. 10/2018 HbA1c = 6.3.  . CAD (coronary artery disease)    a. 11/10/2018 NSTEMI/Cath ([place]): LM 70ost (dampening), LAD 70-80p, D1 20-30, LCX 70-80p, RCA 27mw/ L->R collats, RPDA/RPL min irregs.  . History of echocardiogram    a. 11/10/2017 Echo: Nl LV fxn. Triv MR/TR.  .Marland KitchenHypertension   . [place]   . Tobacco abuse     Past Surgical History:  Procedure Laterality Date  . TONSILLECTOMY       Allergies  No Known Allergies  History of Present Illness    58y/o ? with a h/o HTN, tob abuse, etoh abuse, and obesity.  He has no prior cardiac history.  He was in his usoh until about 2 mos ago, when he began to experience intermittent rest and exertional sscp, frequently occurring @ night with lying down, radiating to the left forearm, assoc w/ dyspnea, lasting a few mins, and resolving with rest or sitting up in bed.  [place] chest discomfort, prompting him to present to a local ED on 1/13 where he was found to have an elevated HS troponin, eventually peaking @ 1797.  Echo showed nl EF.  Cath was performed 1/15 and showed severe multivessel CAD involving the LM/LAD/LCX/RCA.  There was dampening of the waveform with engagement of the LM with catheter.  He was advised that he would require CABG and was seen by CT surgery with a plan for CABG yesterday.  He had several things to take  care of however - work/family - and thus, he opted to leave AMA and fly home via private jet.  Since he left the hospital yesterday morning, he has had some chest tightness when walking in the airport but no sustained symptoms.  [facility] for eval and admission after speaking with Dr. SLia Foyerby phone earlier today.  He is currently c/p free.  Home Medications    Prior to Admission medications   Medication Sig Start Date End Date Taking? Authorizing Provider  amLODipine (NORVASC) 10 MG tablet Take 1 tablet (10 mg total) by mouth daily. 03/11/14   SFransico Meadow PA-C  lansoprazole (PREVACID) 30 MG capsule Take 1 capsule (30 mg total) by mouth daily at 12 noon. 03/11/14   SFransico Meadow PA-C    Family History    Family History  Problem Relation Age of Onset  . Alzheimer's disease Mother   . High blood pressure Father   . Thyroid disease Sister   . Other Brother        a & w  . Other Brother        a & w   He indicated that his mother is deceased. He indicated that his father is deceased. He indicated that his sister is alive. He indicated that both of his  brothers are alive.   Social History    Social History   Socioeconomic History  . Marital status: Married    Spouse name: Not on file  . Number of children: Not on file  . Years of education: Not on file  . Highest education level: Not on file  Occupational History  . Not on file  Social Needs  . Financial resource strain: Not on file  . Food insecurity:    Worry: Not on file    Inability: Not on file  . Transportation needs:    Medical: Not on file    Non-medical: Not on file  Tobacco Use  . Smoking status: Current Every Day Smoker    Packs/day: 1.00    Years: 40.00    Pack years: 40.00    Types: Cigarettes  Substance and Sexual Activity  . Alcohol use: Yes    Comment: at least a 12 pack of beer/wk.  . Drug use: No  . Sexual activity: Never    Birth control/protection: None   Lifestyle  . Physical activity:    Days per week: Not on file    Minutes per session: Not on file  . Stress: Not on file  Relationships  . Social connections:    Talks on phone: Not on file    Gets together: Not on file    Attends religious service: Not on file    Active member of club or organization: Not on file    Attends meetings of clubs or organizations: Not on file    Relationship status: Not on file  . Intimate partner violence:    Fear of current or ex partner: Not on file    Emotionally abused: Not on file    Physically abused: Not on file    Forced sexual activity: Not on file  Other Topics Concern  . Not on file  Social History Narrative   Lives locally with wife/dtr.  Does not routinely exercise.  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     Mosier:  No chills, fever, night sweats or weight changes.  Cardiovascular:  +++ chest pain, +++ dyspnea on exertion, no edema, orthopnea, palpitations, paroxysmal nocturnal dyspnea. Dermatological: No rash, lesions/masses Respiratory: No cough, +++ dyspnea Urologic: No hematuria, dysuria Abdominal:   No nausea, vomiting, diarrhea, bright red blood per rectum, melena, or hematemesis Neurologic:  No visual changes, wkns, changes in mental status. All other systems reviewed and are otherwise negative except as noted above.  Physical Exam     BP Readings from Last 3 Encounters:  11/13/18 (!) 158/89   Pulse Readings from Last 3 Encounters:  11/13/18 90    General: Pleasant, NAD Psych: Normal affect. Neuro: Alert and oriented X 3. Moves all extremities spontaneously. HEENT: Normal  Neck: Supple without bruits or JVD. Lungs:  Resp regular and unlabored, CTA. Heart: RRR no s3, s4, or murmurs. Abdomen: Soft, non-tender, non-distended, BS + x 4.  Extremities: No clubbing, cyanosis or edema. DP/PT/Radials 2+ and equal bilaterally.  Labs    11/12/2018 H/H 13.9/40.6 WBC 10.3 Plt  231  Na 136, K 4.0, Cl 104, CO2 24.7, BUN 12, Creat 0.9, Gluc 110  11/10/2018 TC 230, TG 231, HDL 37, LDL 147 HbA1c 6.3 Tbili 0.6, Alk Phos 178, AST 35, ALT 36 HS Ti 1293  1797  1575  1360 _____________   Carotid U/S 11/11/2018 IMPRESSION: No evidence of hemodynamically significant carotid stenosis by criteria similar to  NASCET. Less than 50% stenosis of bilateral internal carotid arteries. _____________   CXR 11/09/2018 Edi, Rad Results In - 11/09/2018  9:49 AM CST CLINICAL HISTORY: Acute chest pain.  COMPARISON: None.  TECHNIQUE: PA and lateral views of the chest.   FINDINGS: Heart size is within normal limits. The pulmonary vasculature is unremarkable. No evidence of focal airspace consolidation, pleural effusion, or pneumothorax. The visualized osseous structures are intact.  IMPRESSION:  No acute cardiopulmonary process. _____________   2D Echocardiogram 11/10/2018  . Left Ventricle: Left ventricular cavity appears normal. . Left Ventricle: Wall thickness is within normal limits. . Left Ventricle: There is no diastolic dysfunction and normal left  atrial pressure. . Left Ventricle: The GLS is -15.4 %. . Mitral Valve: The leaflets exhibit normal excursion. . Mitral Valve: There is trace regurgitation. . Aortic Valve: The aortic valve is a normal tricuspid. . Aortic Valve: There is no regurgitation or stenosis. . Tricuspid Valve: There is trace regurgitation. _____________    Radiology Studies    Dg Chest 2 View  Result Date: 11/13/2018 CLINICAL DATA:  Cardiac episode today EXAM: CHEST - 2 VIEW COMPARISON:  Mar 11, 2014 FINDINGS: The heart size and mediastinal contours are within normal limits. Both lungs are clear. The visualized skeletal structures are unremarkable. IMPRESSION: No active cardiopulmonary disease. Electronically Signed   By: Abelardo Diesel M.D.   On: 11/13/2018 19:44    ECG & Cardiac Imaging    Sinus tachycardia, 102, inf/lat infarcts -  personally reviewed.  Assessment & Plan    1.  NSTEMI/CAD:  Pt presents w/ a 2 month h/o rest and exertional chest pain and dyspnea.  He was just evaluated in [place] for NSTEMI between 1/13 and 1/16.  He underwent cath that showed severe LM, LAD, LCX, and RCA dzs w/ L  R collaterals.  Dampening of pressure waveform was noted with engagement of the LM.  He was seen by CT surgery w/ rec for CABG on 1/16.  Echo showed nl EF.  Carotid U/S w/o significant dzs.  Pt opted to leave AMA and fly back to [place] as he preferred to be nearer his family and have his surgery performed at our top performing center.  He is currently c/p free.  We will admit to progressive care and add asa, statin,  blocker, and lovenox.  Given h/o rest angina and elevated BP, I will add NTP.  Unfortunately, he did not obtain his cath films prior to leaving the hospital, thus we will have to try and obtain them over the weekend and also contact CT surgery in the AM. Of note, I did review his admission records (available in care everywhere).  I could not find any documentation of provision of any antiplatelet other than ASA.  2.  Essential HTN:  BP elevated.  Adding  blocker.  3.  HL:  LDL 147.  Adding lipitor 80.  4.  Tob Abuse:  Says he smoked his last cigarette this AM.  Complete cessation advised.  5.  ETOH Use:  12 pack/wk.  Cessation advised.  6.  Borderline DM:  A1c 6.3.  Will benefit from wt loss/dietary counseling, eventual cardiac rehab.  7.  Morbid Obesity:  See #6.  Signed, Murray Hodgkins, NP 11/13/2018, 7:53 PM  Personally seen and examined. Agree with above.   58 year old male with recent non-ST elevation myocardial infarction, severe left main coronary artery disease, peak high-sensitivity troponin of 1700 (similar to 1.7 in our current assay), smoker, morbidly obese, hypertensive, alcohol  use travels frequently with work who presents the emergency department for bypass surgery.  Was in [place] on business,  eventually was coaxed by his coworker to go to the emergency room for further evaluation of chest discomfort that he had been having off-and-on over the past 2 or 3 months.  Pressure left-sided upper chest which radiated to left arm.  He has been feeling this off and on intermittently.  Hospital there diagnosed him with non-ST elevation myocardial infarction.  He underwent cardiac catheterization as reviewed above.  Significant 70% left main stenosis with catheter tip dampening.  Ejection fraction was reportedly normal.  GEN: Well nourished, well developed, in no acute distress  HEENT: normal  Neck: no JVD, carotid bruits, or masses Cardiac: RRR; no murmurs, rubs, or gallops,no edema  Respiratory:  clear to auscultation bilaterally, normal work of breathing GI: soft, nontender, nondistended, + BS, obese MS: no deformity or atrophy  Skin: warm and dry, no rash Neuro:  Alert and Oriented x 3, Strength and sensation are intact Psych: euthymic mood, full affect  Assessment and plan:  Severe multivessel coronary artery disease with non-ST elevation myocardial infarction awaiting CABG - We will place him on Lovenox injection -High intensity statin, beta-blocker, aspirin.  He did not receive Plavix. - He understands that we will need to obtain a disc of angiogram to allow cardiothoracic surgery here to view prior to surgery.  I have asked [employer]ie [employer] to help [place] with this process.  Perhaps tomorrow during work hours, a call can be placed to the hospital where cardiac catheterization was performed, see care everywhere, and request for disc expedited can be made. -Consult cardiothoracic surgery here tomorrow.  He states that he did not have vein mapping done.  Tobacco abuse -Cessation counseling  Morbid obesity -Continue to encourage weight loss  Alcohol use - Decrease usage.  Candee Furbish, MD

## 2018-11-13 NOTE — ED Notes (Signed)
Cards @ bedside

## 2018-11-13 NOTE — Progress Notes (Signed)
ANTICOAGULATION CONSULT NOTE - Initial Consult  Pharmacy Consult for lovenox Indication: chest pain/ACS  No Known Allergies  Patient Measurements:   Heparin Dosing Weight:   Vital Signs: BP: 158/89 (01/17 1957) Pulse Rate: 90 (01/17 1957)  Labs: Recent Labs    11/13/18 1833  HGB 14.5  HCT 45.4  PLT 269  CREATININE 1.17    CrCl cannot be calculated (Unknown ideal weight.).   Medical History: Past Medical History:  Diagnosis Date  . Alcohol use   . Borderline diabetes    a. 10/2018 HbA1c = 6.3.  . CAD (coronary artery disease)    a. 11/10/2018 NSTEMI/Cath [facility] [place]): LM 70ost (dampening), LAD 70-80p, D1 20-30, LCX 70-80p, RCA 65m w/ L->R collats, RPDA/RPL min irregs.  . History of echocardiogram    a. 11/10/2017 Echo: Nl LV fxn. Triv MR/TR.  Marland Kitchen Hypertension   . Morbid obesity ([facility])   . Tobacco abuse     Assessment: 61 yom presented to the ED with CP. To start therapeutic lovenox. Baseline H/H and platelets are WNL. He is not on anticoagulation PTA.   Goal of Therapy:  Anti-Xa level 0.6-1 units/ml 4hrs after LMWH dose given Monitor platelets by anticoagulation protocol: Yes   Plan:  Lovenox 110mg  SQ Q12H CBC Q72H F/u plans for surgery  Neill Jurewicz, Drake Leach 11/13/2018,8:23 PM

## 2018-11-14 ENCOUNTER — Encounter (HOSPITAL_COMMUNITY): Payer: Self-pay

## 2018-11-14 ENCOUNTER — Other Ambulatory Visit: Payer: Self-pay

## 2018-11-14 DIAGNOSIS — I214 Non-ST elevation (NSTEMI) myocardial infarction: Secondary | ICD-10-CM | POA: Diagnosis not present

## 2018-11-14 DIAGNOSIS — I2510 Atherosclerotic heart disease of native coronary artery without angina pectoris: Secondary | ICD-10-CM | POA: Diagnosis present

## 2018-11-14 LAB — BASIC METABOLIC PANEL
Anion gap: 9 (ref 5–15)
BUN: 8 mg/dL (ref 6–20)
CO2: 21 mmol/L — ABNORMAL LOW (ref 22–32)
Calcium: 8.8 mg/dL — ABNORMAL LOW (ref 8.9–10.3)
Chloride: 108 mmol/L (ref 98–111)
Creatinine, Ser: 1.02 mg/dL (ref 0.61–1.24)
GFR calc Af Amer: >60 mL/min (ref >60)
GFR calc non Af Amer: >60 mL/min (ref >60)
Glucose, Bld: 133 mg/dL — ABNORMAL HIGH (ref 70–99)
Potassium: 3.7 mmol/L (ref 3.5–5.1)
Sodium: 138 mmol/L (ref 135–145)

## 2018-11-14 LAB — CBC WITH DIFFERENTIAL/PLATELET
Abs Immature Granulocytes: 0.05 10*3/uL (ref 0.00–0.07)
Basophils Absolute: 0.1 10*3/uL (ref 0.0–0.1)
Basophils Relative: 1 %
Eosinophils Absolute: 0.4 10*3/uL (ref 0.0–0.5)
Eosinophils Relative: 4 %
HCT: 42.5 % (ref 39.0–52.0)
Hemoglobin: 14.2 g/dL (ref 13.0–17.0)
Immature Granulocytes: 0 %
Lymphocytes Relative: 38 %
Lymphs Abs: 4.3 10*3/uL — ABNORMAL HIGH (ref 0.7–4.0)
MCH: 33.3 pg (ref 26.0–34.0)
MCHC: 33.4 g/dL (ref 30.0–36.0)
MCV: 99.5 fL (ref 80.0–100.0)
MONO ABS: 0.7 10*3/uL (ref 0.1–1.0)
MONOS PCT: 6 %
Neutro Abs: 5.7 10*3/uL (ref 1.7–7.7)
Neutrophils Relative %: 51 %
Platelets: 270 10*3/uL (ref 150–400)
RBC: 4.27 MIL/uL (ref 4.22–5.81)
RDW: 13 % (ref 11.5–15.5)
WBC: 11.1 10*3/uL — ABNORMAL HIGH (ref 4.0–10.5)
nRBC: 0 % (ref 0.0–0.2)

## 2018-11-14 LAB — TROPONIN I
Troponin I: 0.45 ng/mL (ref <0.03)
Troponin I: 0.31 ng/mL (ref <0.03)

## 2018-11-14 MED ORDER — NICOTINE 21 MG/24HR TD PT24
21.0000 mg | MEDICATED_PATCH | Freq: Every day | TRANSDERMAL | Status: DC
  Administered 2018-11-14 – 2018-11-15 (×2): 21 mg via TRANSDERMAL
  Filled 2018-11-14 (×2): qty 1

## 2018-11-14 NOTE — Progress Notes (Signed)
DAILY PROGRESS NOTE   Patient Name: Gabriel Green Date of Encounter: 11/14/2018  Chief Complaint   No chest pain overnight  Patient Profile   58 yo male with recent chest pain and NSTEMI, found to have multivessel CAD while working in [place], [place] - elected to return to [facility] for CABG evaluation.  Subjective   No further chest pain at rest. Needs CABG evaluation.   Objective   Vitals:   11/13/18 2100 11/13/18 2115 11/13/18 2142 11/14/18 0418  BP: 132/76 138/77 139/79 115/84  Pulse: 82 85 87 74  Resp: '18 12 16 18  ' Temp:   98.1 F (36.7 C) 98.2 F (36.8 C)  TempSrc:   Oral Oral  SpO2: 95% 97% 99% 98%  Weight:    112.7 kg    Intake/Output Summary (Last 24 hours) at 11/14/2018 1055 Last data filed at 11/14/2018 0700 Gross per 24 hour  Intake 360 ml  Output -  Net 360 ml   Filed Weights   11/14/18 0418  Weight: 112.7 kg    Physical Exam   General appearance: alert and no distress Neck: no carotid bruit, no JVD and thyroid not enlarged, symmetric, no tenderness/mass/nodules Lungs: clear to auscultation bilaterally Heart: regular rate and rhythm, S1, S2 normal, no murmur, click, rub or gallop Abdomen: moderately obese Extremities: extremities normal, atraumatic, no cyanosis or edema Pulses: 2+ and symmetric Skin: Skin color, texture, turgor normal. No rashes or lesions Neurologic: Grossly normal Psych: Pleasant  Inpatient Medications    Scheduled Meds: . aspirin EC  81 mg Oral Daily  . atorvastatin  80 mg Oral q1800  . carvedilol  3.125 mg Oral BID WC  . enoxaparin (LOVENOX) injection  110 mg Subcutaneous Q12H  . nicotine  21 mg Transdermal Daily  . nitroGLYCERIN  1 inch Topical Q6H  . pantoprazole  40 mg Oral Q0600    Continuous Infusions:   PRN Meds: acetaminophen, nitroGLYCERIN, ondansetron (ZOFRAN) IV   Labs   Results for orders placed or performed during the hospital encounter of 11/13/18 (from the past 48 hour(s))  Basic metabolic panel      Status: Abnormal   Collection Time: 11/13/18  6:33 PM  Result Value Ref Range   Sodium 139 135 - 145 mmol/L   Potassium 3.6 3.5 - 5.1 mmol/L   Chloride 107 98 - 111 mmol/L   CO2 22 22 - 32 mmol/L   Glucose, Bld 129 (H) 70 - 99 mg/dL   BUN 7 6 - 20 mg/dL   Creatinine, Ser 1.17 0.61 - 1.24 mg/dL   Calcium 9.0 8.9 - 10.3 mg/dL   GFR calc non Af Amer >60 >60 mL/min   GFR calc Af Amer >60 >60 mL/min   Anion gap 10 5 - 15    Comment: Performed at [facility], [place] [address], [place], [place] [address]  CBC     Status: Abnormal   Collection Time: 11/13/18  6:33 PM  Result Value Ref Range   WBC 11.8 (H) 4.0 - 10.5 K/uL   RBC 4.56 4.22 - 5.81 MIL/uL   Hemoglobin 14.5 13.0 - 17.0 g/dL   HCT 45.4 39.0 - 52.0 %   MCV 99.6 80.0 - 100.0 fL   MCH 31.8 26.0 - 34.0 pg   MCHC 31.9 30.0 - 36.0 g/dL   RDW 12.7 11.5 - 15.5 %   Platelets 269 150 - 400 K/uL   nRBC 0.0 0.0 - 0.2 %    Comment: Performed at [facility]  [facility], [place] [address], [place], [place] [address]  I-stat troponin, ED     Status: Abnormal   Collection Time: 11/13/18  7:02 PM  Result Value Ref Range   Troponin i, poc 0.50 (HH) 0.00 - 0.08 ng/mL   Comment 3            Comment: Due to the release kinetics of cTnI, a negative result within the first hours of the onset of symptoms does not rule out myocardial infarction with certainty. If myocardial infarction is still suspected, repeat the test at appropriate intervals.   Troponin I - Now Then Q6H     Status: Abnormal   Collection Time: 11/13/18 10:11 PM  Result Value Ref Range   Troponin I 0.52 (HH) <0.03 ng/mL    Comment: CRITICAL RESULT CALLED TO, READ BACK BY AND VERIFIED WITH: HELEM M,RN 11/13/18 2322 WAYK Performed at [facility], [place] [address], [place], [place] [address]   Troponin I - Now Then Q6H     Status: Abnormal   Collection Time: 11/14/18  4:16 AM  Result Value Ref Range   Troponin I 0.45 (HH) <0.03 ng/mL    Comment: CRITICAL VALUE NOTED.   VALUE IS CONSISTENT WITH PREVIOUSLY REPORTED AND CALLED VALUE. Performed at [facility], [place] [address], [place], [place] [address]   Basic metabolic panel     Status: Abnormal   Collection Time: 11/14/18  4:16 AM  Result Value Ref Range   Sodium 138 135 - 145 mmol/L   Potassium 3.7 3.5 - 5.1 mmol/L   Chloride 108 98 - 111 mmol/L   CO2 21 (L) 22 - 32 mmol/L   Glucose, Bld 133 (H) 70 - 99 mg/dL   BUN 8 6 - 20 mg/dL   Creatinine, Ser 1.02 0.61 - 1.24 mg/dL   Calcium 8.8 (L) 8.9 - 10.3 mg/dL   GFR calc non Af Amer >60 >60 mL/min   GFR calc Af Amer >60 >60 mL/min   Anion gap 9 5 - 15    Comment: Performed at [place] [address] [place], [place] [address]  CBC WITH DIFFERENTIAL     Status: Abnormal   Collection Time: 11/14/18  4:16 AM  Result Value Ref Range   WBC 11.1 (H) 4.0 - 10.5 K/uL   RBC 4.27 4.22 - 5.81 MIL/uL   Hemoglobin 14.2 13.0 - 17.0 g/dL   HCT 42.5 39.0 - 52.0 %   MCV 99.5 80.0 - 100.0 fL   MCH 33.3 26.0 - 34.0 pg   MCHC 33.4 30.0 - 36.0 g/dL   RDW 13.0 11.5 - 15.5 %   Platelets 270 150 - 400 K/uL   nRBC 0.0 0.0 - 0.2 %   Neutrophils Relative % 51 %   Neutro Abs 5.7 1.7 - 7.7 K/uL   Lymphocytes Relative 38 %   Lymphs Abs 4.3 (H) 0.7 - 4.0 K/uL   Monocytes Relative 6 %   Monocytes Absolute 0.7 0.1 - 1.0 K/uL   Eosinophils Relative 4 %   Eosinophils Absolute 0.4 0.0 - 0.5 K/uL   Basophils Relative 1 %   Basophils Absolute 0.1 0.0 - 0.1 K/uL   Immature Granulocytes 0 %   Abs Immature Granulocytes 0.05 0.00 - 0.07 K/uL    Comment: Performed at [facility], [address], [place], [place] [address]    ECG   Sinus tachy at 102 - Personally Reviewed  Telemetry   Sinus  rhythm - Personally Reviewed  Radiology    Dg Chest 2 View  Result Date: 11/13/2018 CLINICAL DATA:  Cardiac episode today EXAM: CHEST - 2 VIEW COMPARISON:  Mar 11, 2014 FINDINGS: The heart size and mediastinal contours are within normal limits. Both lungs are  clear. The visualized skeletal structures are unremarkable. IMPRESSION: No active cardiopulmonary disease. Electronically Signed   By: Abelardo Diesel M.D.   On: 11/13/2018 19:44    Cardiac Studies   CARDIAC CATHETERIZATION REPORT  INDICATION: Non-ST elevation myocardial infarction  Interventional Cardiology Attending: Dr. Carlynn Spry  Fellow: Dr Harlow Mares  Date of Procedure: November 11, 2018  PROCEDURE:   1. IV concious sedation and continuous hemodynamic monitoring 2. Access to right femoral artery via modified Seldinger technique 3. Left heart catheterization with crossing of the aortic valve into the  left ventricle with pressures obtained 4. Bilateral selective coronary angiography 5. Selective right iliofemoral angiography for device closure possibility 6. Patent hemostasis achieved in the right femoral artery using a 6  French Angio-Seal device     Equipment - Please refer to merge system   Anesthesia: - IV concious sedation, Local analgesia with 1% lidocaine infused into the  subcutaneous tissue of the R groin   Moderate sedation services provided by Dr Carlynn Spry and other qualified  health care professionals performing the diagnostic or therapeutic service  that the sedation supports, requiring the presence of an independent  trained observer to assist in the monitoring of the patient's level of  consciousness and physiological status.   Sedation start time 1346 - End time 1406  Contrast: 90 cc   HEMODYNAMICS:  AO: 101/57/78 mmHg LVEDP: 20 mmHg LV: 100/4/19 mmHg    CORONARY ANGIOGRAPHY: Dominance: Right Left Main: The left main is a large caliber vessel that bifurcates into  the LAD and left circumflex arteries, there is a 70% stenotic lesion at  the ostium with significant drop in blood pressure when the vessel was  engaged with the catheter, TIMI-3 flow LAD: The LAD is a large caliber vessel that wraps around to the apex,  there is a 70 to 80%  stenotic lesion in the proximal segment, the  remainder of the vessel has mild disease, TIMI-3 flow. There are left to  right collaterals present. Diagonals: The first diagonal is a large caliber vessel that has 20 to 30%  stenosis LCx: Left circumflex is a large caliber vessel that has 70 to 80% stenosis  in the proximal segment, the remainder of the vessel has mild disease,  TIMI-3 flow Obtuse marginals: The first OM is a large caliber vessel with no  significant disease, the second and third OM are small-caliber vessels  with mild disease RCA: The RCA is a large caliber vessel, there is mild disease in the  proximal segment, there is diffuse disease in the midsegment with a 90%  stenotic lesion, the distal vessel has mild disease, TIMI-3 flow. There  are left to right collaterals present PDA/PLA: These are moderate caliber vessels with mild luminal  irregularities     Procedure: Patient was bought to the cardiac catheterization lab after  obtaining informed consent. B/L groins were prepped and draped in sterile  manner. Arterial Access was obtained via R common femoral A. Using  modified Seldinger technique using micropuncture kit and ultrasound  guidance. A 6 French sheath was inserted into the right femoral artery.  A JL4 catheter was inserted through the sheath and advanced into the a  sending aorta where was used to  engage the left main coronary artery.  Selective left coronary angiography was performed in multiple views.  Next, the JL4 catheter was exchanged for a JR4 catheter which was used to  engage the right coronary artery selectively. Right coronary angiography  was performed in multiple views. Next, a pigtail catheter was inserted  and used to cross the aortic valve into the left ventricle where pressures  were recorded. We then performed a limited iliofemoral angiography on the  right to determine if device closure was a possibility. A 6 French    Angio-Seal device was deployed in the right femoral artery to achieve  patent hemostasis. The patient tolerated the procedure well without any  complications. He left the Cath Lab in stable condition return to post  cardiac cath recovery for close monitoring.     Intervention: None  Hemostasis: Once patient was deemend an appropriate candidate for closure device  placement a 6Fr Angioseal was successfully inserted into R Common Femoral  A. With excellent hemostasis achieved.    Complications:  None. At the end of the procedure the patient was transferred out of the  Cardiac catheterization laboratory in hemodynamically and clinically  stable condition.  CONCLUSION: 1. Significant multivessel coronary artery disease as described above  including an 80% stenotic lesion in the proximal LAD, 80% stenotic lesion  in the proximal left circumflex, and 90% stenotic lesion in the mid RCA 2. Hypertension 3. Nicotine abuse  RECOMMENDATION: 1. We discussed with the patient the need for CABG. We will consult  cardiothoracic surgery, Dr. Vira Blanco, to evaluate the patient 2. Continue the heparin 3. Obtain carotid Dopplers 4. High intensity statin 5. Hold antiplatelets until we know plan for possible surgery 6. Monitor closely and post cath recovery and transfer back to the floor  Discussed with Attending, Dr. Jon Gills [facility] Interventional cardiology fellow 11/11/2018  Assessment   Principal Problem:   NSTEMI (non-ST elevated myocardial infarction) [facility]) Active Problems:   CAD, multiple vessel   Plan   Mr. Linsey will need CABG evaluation - I have placed a call to CT surgery for evaluation. Labs stable - troponin is flat elevated. On therapeutic lovenox - chest pain free. Echo shows normal LV function.   Time Spent Directly with Patient:  I have spent a total of 25 minutes with the patient reviewing hospital notes, telemetry, EKGs, labs and examining  the patient as well as establishing an assessment and plan that was discussed personally with the patient.  > 50% of time was spent in direct patient care.  Length of Stay:  LOS: 1 day   Pixie Casino, MD, [facility] [place] Director of the Advanced Lipid Disorders &  Cardiovascular Risk Reduction Clinic Diplomate of the American Board of Clinical Lipidology Attending Cardiologist  Direct Dial: [phone]  Fax: [phone]  Website:  [email or web address] Madie Cahn 11/14/2018, 10:55 AM

## 2018-11-14 NOTE — Progress Notes (Signed)
   I called [facility] [phone] in an attempt to have cath films mailed to our cath lab. Their medical records dept was closed. I spoke to the nursing supervisor who does not think she has a way of getting the films mailed over there weekend but she will look into it and call me back if she finds a way. Otherwise the records will need to be requested on Monday.   Berton Bon, AGNP-C [facility] 11/14/2018  11:18 AM Pager: [phone]

## 2018-11-14 NOTE — Plan of Care (Signed)
  Problem: Education: Goal: Understanding of CV disease, CV risk reduction, and recovery process will improve Outcome: Progressing Goal: Individualized Educational Video(s) Outcome: Progressing   Problem: Activity: Goal: Ability to return to baseline activity level will improve Outcome: Progressing   

## 2018-11-14 NOTE — Progress Notes (Signed)
ANTICOAGULATION CONSULT NOTE - Follow-Up Consult  Pharmacy Consult for lovenox Indication: chest pain/ACS  No Known Allergies  Patient Measurements: Weight: 248 lb 6.4 oz (112.7 kg) Heparin Dosing Weight:   Vital Signs: Temp: 98.2 F (36.8 C) (01/18 0418) Temp Source: Oral (01/18 0418) BP: 115/84 (01/18 0418) Pulse Rate: 74 (01/18 0418)  Labs: Recent Labs    11/13/18 1833 11/13/18 2211 11/14/18 0416 11/14/18 1047  HGB 14.5  --  14.2  --   HCT 45.4  --  42.5  --   PLT 269  --  270  --   CREATININE 1.17  --  1.02  --   TROPONINI  --  0.52* 0.45* 0.31*    CrCl cannot be calculated (Unknown ideal weight.).   Medical History: Past Medical History:  Diagnosis Date  . Alcohol use   . Borderline diabetes    a. 10/2018 HbA1c = 6.3.  . CAD (coronary artery disease)    a. 11/10/2018 NSTEMI/Cath [facility] [place] LM 70ost (dampening), LAD 70-80p, D1 20-30, LCX 70-80p, RCA 39m w/ L->R collats, RPDA/RPL min irregs.  . History of echocardiogram    a. 11/10/2017 Echo: Nl LV fxn. Triv MR/TR.  Marland Kitchen Hypertension   . Morbid obesity (HCC)   . Tobacco abuse     Assessment: 66 yom presented to the ED with CP. To start therapeutic lovenox. Baseline H/H and platelets are WNL. He is not on anticoagulation PTA.   Goal of Therapy:  Anti-Xa level 0.6-1 units/ml 4hrs after LMWH dose given Monitor platelets by anticoagulation protocol: Yes   Plan:  Lovenox 110mg  SQ Q12H CBC Q72H F/u plans for surgery  Fredonia Highland, PharmD, BCPS Clinical Pharmacist [phone] Please check AMION for all [facility] numbers 11/14/2018

## 2018-11-14 NOTE — Progress Notes (Signed)
To followup, I called his cardiologist office and spoke to their after hour answering service to see if able to obtain a disc, then I was transferred to the radiology service who informed me the only people to can burn a disc is the cath lab. Cath lab will not open until Monday.   Direct number to cath lab is [phone] Ext. [phone]  Informed Dr. Rennis Golden

## 2018-11-15 DIAGNOSIS — I10 Essential (primary) hypertension: Secondary | ICD-10-CM

## 2018-11-15 DIAGNOSIS — I25110 Atherosclerotic heart disease of native coronary artery with unstable angina pectoris: Secondary | ICD-10-CM

## 2018-11-15 DIAGNOSIS — I214 Non-ST elevation (NSTEMI) myocardial infarction: Secondary | ICD-10-CM | POA: Diagnosis not present

## 2018-11-15 DIAGNOSIS — I2510 Atherosclerotic heart disease of native coronary artery without angina pectoris: Secondary | ICD-10-CM | POA: Diagnosis not present

## 2018-11-15 LAB — MRSA PCR SCREENING: MRSA by PCR: NEGATIVE

## 2018-11-15 MED ORDER — NITROGLYCERIN 0.4 MG SL SUBL: 0.4000 mg | SUBLINGUAL_TABLET | SUBLINGUAL | 3 refills | Status: DC | PRN

## 2018-11-15 MED ORDER — ASPIRIN 81 MG PO TBEC: 81.0000 mg | DELAYED_RELEASE_TABLET | Freq: Every day | ORAL | Status: AC

## 2018-11-15 MED ORDER — ATORVASTATIN CALCIUM 80 MG PO TABS: 80.0000 mg | ORAL_TABLET | Freq: Every day | ORAL | 3 refills | Status: AC

## 2018-11-15 MED ORDER — CARVEDILOL 3.125 MG PO TABS: 3.1250 mg | ORAL_TABLET | Freq: Two times a day (BID) | ORAL | 11 refills | Status: DC

## 2018-11-15 NOTE — Discharge Summary (Signed)
Discharge Summary    Patient ID: Carollee LeitzJohnny Finnell MRN: [id number]; DOB: August 13, 1961  Admit date: 11/13/2018 Discharge date: 11/15/2018  Primary Care Provider: Patient, No Pcp Per  Primary Cardiologist: Donato SchultzMark Skains, MD  Primary Electrophysiologist:  None   Discharge Diagnoses    Principal Problem:   NSTEMI (non-ST elevated myocardial infarction) [facility](HCC) Active Problems:   CAD, multiple vessel   Allergies No Known Allergies  Diagnostic Studies/Procedures    CARDIAC CATHETERIZATION REPORT CORONARY ANGIOGRAPHY: Dominance: Right Left Main: The left main is a large caliber vessel that bifurcates into  the LAD and left circumflex arteries, there is a 70% stenotic lesion at  the ostium with significant drop in blood pressure when the vessel was  engaged with the catheter, TIMI-3 flow LAD: The LAD is a large caliber vessel that wraps around to the apex,  there is a 70 to 80% stenotic lesion in the proximal segment, the  remainder of the vessel has mild disease, TIMI-3 flow. There are left to  right collaterals present. Diagonals: The first diagonal is a large caliber vessel that has 20 to 30%  stenosis LCx: Left circumflex is a large caliber vessel that has 70 to 80% stenosis  in the proximal segment, the remainder of the vessel has mild disease,  TIMI-3 flow Obtuse marginals: The first OM is a large caliber vessel with no  significant disease, the second and third OM are small-caliber vessels  with mild disease RCA: The RCA is a large caliber vessel, there is mild disease in the  proximal segment, there is diffuse disease in the midsegment with a 90%  stenotic lesion, the distal vessel has mild disease, TIMI-3 flow. There  are left to right collaterals present PDA/PLA: These are moderate caliber vessels with mild luminal  Irregularities  CONCLUSION: 1. Significant multivessel coronary artery disease as described above  including an 80% stenotic lesion in the proximal LAD,  80% stenotic lesion  in the proximal left circumflex, and 90% stenotic lesion in the mid RCA 2. Hypertension 3. Nicotine abuse  RECOMMENDATION: 1. We discussed with the patient the need for CABG. We will consult  cardiothoracic surgery, Dr. Gray Bernhardtuchek, to evaluate the patient 2. Continue the heparin 3. Obtain carotid Dopplers 4. High intensity statin 5. Hold antiplatelets until we know plan for possible surgery 6. Monitor closely and post cath recovery and transfer back to the floor  Discussed with Attending, Dr. Rogelia BogaMartini _____________   History of Present Illness     58 y/o ? with a h/o HTN, tob abuse, etoh abuse, and obesity.  He has no prior cardiac history.  He was in his usoh until about 2 mos ago, when he began to experience intermittent rest and exertional sscp, frequently occurring @ night with lying down, radiating to the left forearm, assoc w/ dyspnea, lasting a few mins, and resolving with rest or sitting up in bed.  He was recently traveling for work in [place], and developed recurrent chest discomfort, prompting him to present to a local ED on 1/13 where he was found to have an elevated HS troponin, eventually peaking @ 1797.  Echo showed nl EF.  Cath was performed 1/15 and showed severe multivessel CAD involving the LM/LAD/LCX/RCA.  There was dampening of the waveform with engagement of the LM with catheter.  He was advised that he would require CABG and was seen by CT surgery with a plan for CABG yesterday.  He had several things to take care of however - work/family - and thus,  he opted to leave AMA and fly home via private jet.  Since he left the hospital yesterday morning, he has had some chest tightness when walking in the airport but no sustained symptoms.  Upon returning to [facility], he presented to the ED for eval and admission after speaking with Dr. Riley Kill by phone earlier today.  He is currently c/p free.  Hospital Course     Consultants: Dr. Laneta Simmers   Patient was  admitted under cardiology service.  Serial troponin were elevated during this admission.  Initial troponin was 0.52 and slowly trended down.  Unfortunately before he left [place] area, he did not obtain his cath, therefore we were unable to proceed with bypass surgery.  Multiple attempts were made to reach out to [facility] in order to obtain her cath film, all were unsuccessful on the weekend.  Patient was seen by Dr. Laneta Simmers of CT surgery on 11/15/2018, he was agreeable to proceed with bypass surgery.  Given the asymptomatic nature, he is deemed stable for discharge from CV surgery perspective.  The earliest day he can have bypass surgery will be next Thursday.  We plan to obtain from his recent hospitalization tomorrow for it to be mailed overnight to [facility].  _____________  Discharge Vitals Blood pressure 123/72, pulse 77, temperature 97.7 F (36.5 C), temperature source Oral, resp. rate 18, height 5\' 9"  (1.753 m), weight 113 kg, SpO2 96 %.  Filed Weights   11/14/18 0418 11/15/18 0437  Weight: 112.7 kg 113 kg    Labs & Radiologic Studies    CBC Recent Labs    11/13/18 1833 11/14/18 0416  WBC 11.8* 11.1*  NEUTROABS  --  5.7  HGB 14.5 14.2  HCT 45.4 42.5  MCV 99.6 99.5  PLT 269 270   Basic Metabolic Panel Recent Labs    16/96/78 1833 11/14/18 0416  NA 139 138  K 3.6 3.7  CL 107 108  CO2 22 21*  GLUCOSE 129* 133*  BUN 7 8  CREATININE 1.17 1.02  CALCIUM 9.0 8.8*   Liver Function Tests No results for input(s): AST, ALT, ALKPHOS, BILITOT, PROT, ALBUMIN in the last 72 hours. No results for input(s): LIPASE, AMYLASE in the last 72 hours. Cardiac Enzymes Recent Labs    11/13/18 2211 11/14/18 0416 11/14/18 1047  TROPONINI 0.52* 0.45* 0.31*   BNP Invalid input(s): POCBNP D-Dimer No results for input(s): DDIMER in the last 72 hours. Hemoglobin A1C No results for input(s): HGBA1C in the last 72 hours. Fasting Lipid Panel No  results for input(s): CHOL, HDL, LDLCALC, TRIG, CHOLHDL, LDLDIRECT in the last 72 hours. Thyroid Function Tests No results for input(s): TSH, T4TOTAL, T3FREE, THYROIDAB in the last 72 hours.  Invalid input(s): FREET3 _____________  Dg Chest 2 View  Result Date: 11/13/2018 CLINICAL DATA:  Cardiac episode today EXAM: CHEST - 2 VIEW COMPARISON:  Mar 11, 2014 FINDINGS: The heart size and mediastinal contours are within normal limits. Both lungs are clear. The visualized skeletal structures are unremarkable. IMPRESSION: No active cardiopulmonary disease. Electronically Signed   By: Sherian Rein M.D.   On: 11/13/2018 19:44   Disposition   Pt is being discharged home today in good condition.  Follow-up Plans & Appointments    Follow-up Information    Alleen Borne, MD Follow up.   Specialty:  Cardiothoracic Surgery Why:  CT surgery scheduler will contact you to arrange bypass surgery. Contact information: [address] [place]  [address] [phone]        Jake Bathe, MD Follow up.   Specialty:  Cardiology Why:  cardiology team will obtain your cath film from [facility] lab and discuss with cardiothoracic surgeon who plan to bring you back once reviewed the film for bypass surgery.  Contact information: [address] [place] [address] [phone]          Discharge Instructions    Diet - low sodium heart healthy   Complete by:  As directed    Increase activity slowly   Complete by:  As directed       Discharge Medications   Allergies as of 11/15/2018   No Known Allergies     Medication List    STOP taking these medications   amLODipine 10 MG tablet Commonly known as:  NORVASC   lansoprazole 30 MG capsule Commonly known as:  PREVACID     TAKE these medications   aspirin 81 MG EC tablet Take 1 tablet (81 mg total) by mouth daily. Start taking on:  November 16, 2018   atorvastatin 80  MG tablet Commonly known as:  LIPITOR Take 1 tablet (80 mg total) by mouth daily at 6 PM.   carvedilol 3.125 MG tablet Commonly known as:  COREG Take 1 tablet (3.125 mg total) by mouth 2 (two) times daily with a meal.   nitroGLYCERIN 0.4 MG SL tablet Commonly known as:  NITROSTAT Place 1 tablet (0.4 mg total) under the tongue every 5 (five) minutes x 3 doses as needed for chest pain.        Acute coronary syndrome (MI, NSTEMI, STEMI, etc) this admission?: Yes.     AHA/ACC Clinical Performance & Quality Measures: 1. Aspirin prescribed? - Yes 2. ADP Receptor Inhibitor (Plavix/Clopidogrel, Brilinta/Ticagrelor or Effient/Prasugrel) prescribed (includes medically managed patients)? - No - planning for CABG 3. Beta Blocker prescribed? - Yes 4. High Intensity Statin (Lipitor 40-80mg  or Crestor 20-40mg ) prescribed? - Yes 5. EF assessed during THIS hospitalization? - No - evaluated prior to arrival 6. For EF <40%, was ACEI/ARB prescribed? - Not Applicable (EF >/= 40%) 7. For EF <40%, Aldosterone Antagonist (Spironolactone or Eplerenone) prescribed? - Not Applicable (EF >/= 40%) 8. Cardiac Rehab Phase II ordered (Included Medically managed Patients)? - No - need CABG     Outstanding Labs/Studies   N/A  Duration of Discharge Encounter   Greater than 30 minutes including physician time.  Ramond Dial, PA 11/15/2018, 4:28 PM

## 2018-11-15 NOTE — Consult Note (Signed)
[address]       [place], [address]             [phone]      Cardiothoracic Surgery Consultation  Reason for Consult: Severe multi-vessel coronary artery disease Referring Physician: Dr. Mali Hilty  Gabriel Green is an 58 y.o. male.  HPI:   The patient is a 58 year old gentleman with history of hypertension, borderline diabetes, and obesity, 1 pack/day smoking, and a family history of heart disease who reports about 37-monthhistory of intermittent chest discomfort associated with shortness of breath typically occurring with exertion like walking up steep steps on a crane with a 20 pound robot.  He was also having episodes of substernal chest discomfort radiating into his forearms at night when laying down in bed usually relieved with sitting up.  [place] and presented to an emergency department there on 11/09/2017.  He was found to have an elevated HS troponin.  It eventually peaked at 1797.  An echocardiogram showed normal ejection fraction.  Cardiac catheterization on 115 reportedly showed severe multivessel disease.  We do not have the cardiac catheterization films yet but have a copy of the cath report.  There was apparently dampening of the waveform with engagement of the left main.  CABG was recommended and he was seen by CT surgery there but desired to come home and opted to leave AMA.  He flew home and presented to the emergency department here on 11/13/2018.  His initial troponin was 0.52 and has decreased since two 0.31 yesterday.  He says that he has not had any chest pain since Sunday a week ago.  The patient works in [employer]  He is usually traveling for 8 to 9 weeks at a time.  He does not exercise.  He smokes about 1 pack of cigarettes per day.  Past Medical History:  Diagnosis Date  . Alcohol use   . Borderline diabetes    a. 10/2018 HbA1c =  6.3.  . CAD (coronary artery disease)    a. 11/10/2018 [place] LM 70ost (dampening), LAD 70-80p, D1 20-30, LCX 70-80p, RCA 939m/ L->R collats, RPDA/RPL min irregs.  . History of echocardiogram    a. 11/10/2017 Echo: Nl LV fxn. Triv MR/TR.  . [place] Kitchenypertension   . Morbid [place] ([place]  . Tobacco abuse     Past Surgical History:  Procedure Laterality Date  . TONSILLECTOMY      Family History  Problem Relation Age of Onset  . Alzheimer's disease Mother   . High blood pressure Father   . Thyroid disease Sister   . Other Brother        a & w  . Other Brother        a & w    Social History:  reports that he has been smoking cigarettes. He has a 40.00 pack-year smoking history. He uses smokeless tobacco. He reports current alcohol use. He reports that he does not use drugs.  Allergies: No Known Allergies  Medications:  I have reviewed the patient's current medications. Prior to Admission:  Medications Prior to Admission  Medication Sig Dispense Refill Last Dose  . amLODipine (NORVASC) 10 MG tablet Take 1 tablet (10 mg total) by mouth daily. (Patient not taking: Reported on 11/13/2018) 30 tablet 1 Not Taking at Unknown time  . lansoprazole (PREVACID) 30 MG capsule  Take 1 capsule (30 mg total) by mouth daily at 12 noon. (Patient not taking: Reported on 11/13/2018) 30 capsule 1 Not Taking at Unknown time   Scheduled: . aspirin EC  81 mg Oral Daily  . atorvastatin  80 mg Oral q1800  . carvedilol  3.125 mg Oral BID WC  . enoxaparin (LOVENOX) injection  110 mg Subcutaneous Q12H  . nicotine  21 mg Transdermal Daily  . nitroGLYCERIN  1 inch Topical Q6H  . pantoprazole  40 mg Oral Q0600   Continuous:  NFA:OZHYQMVHQIONG, nitroGLYCERIN, ondansetron (ZOFRAN) IV Anti-infectives (From admission, onward)   None      Results for orders placed or performed during the hospital encounter of 11/13/18 (from the past 48 hour(s))  Basic metabolic panel     Status:  Abnormal   Collection Time: 11/13/18  6:33 PM  Result Value Ref Range   Sodium 139 135 - 145 mmol/L   Potassium 3.6 3.5 - 5.1 mmol/L   Chloride 107 98 - 111 mmol/L   CO2 22 22 - 32 mmol/L   Glucose, Bld 129 (H) 70 - 99 mg/dL   BUN 7 6 - 20 mg/dL   Creatinine, Ser 1.17 0.61 - 1.24 mg/dL   Calcium 9.0 8.9 - 10.3 mg/dL   GFR calc non Af Amer >60 >60 mL/min   GFR calc Af Amer >60 >60 mL/min   Anion gap 10 5 - 15    Comment: Performed at [facility], [place] [address] [place], [place] [address]  CBC     Status: Abnormal   Collection Time: 11/13/18  6:33 PM  Result Value Ref Range   WBC 11.8 (H) 4.0 - 10.5 K/uL   RBC 4.56 4.22 - 5.81 MIL/uL   Hemoglobin 14.5 13.0 - 17.0 g/dL   HCT 45.4 39.0 - 52.0 %   MCV 99.6 80.0 - 100.0 fL   MCH 31.8 26.0 - 34.0 pg   MCHC 31.9 30.0 - 36.0 g/dL   RDW 12.7 11.5 - 15.5 %   Platelets 269 150 - 400 K/uL   nRBC 0.0 0.0 - 0.2 %    Comment: Performed at [facility], [place] [address], [place], [place] [address]  I-stat troponin, ED     Status: Abnormal   Collection Time: 11/13/18  7:02 PM  Result Value Ref Range   Troponin i, poc 0.50 (HH) 0.00 - 0.08 ng/mL   Comment 3            Comment: Due to the release kinetics of cTnI, a negative result within the first hours of the onset of symptoms does not rule out myocardial infarction with certainty. If myocardial infarction is still suspected, repeat the test at appropriate intervals.   Troponin I - Now Then Q6H     Status: Abnormal   Collection Time: 11/13/18 10:11 PM  Result Value Ref Range   Troponin I 0.52 (HH) <0.03 ng/mL    Comment: CRITICAL RESULT CALLED TO, READ BACK BY AND VERIFIED WITH: HELEM M,RN 11/13/18 2322 WAYK Performed at [facility], [place] [address], [place], [place] [address]   Troponin I - Now Then Q6H     Status: Abnormal   Collection Time: 11/14/18  4:16 AM  Result Value Ref Range   Troponin I 0.45 (HH) <0.03 ng/mL    Comment: CRITICAL VALUE NOTED.  VALUE IS  CONSISTENT WITH PREVIOUSLY REPORTED AND CALLED VALUE. Performed at [facility], [place] [address], [place], [place]  [address]   Basic metabolic panel     Status: Abnormal   Collection Time: 11/14/18  4:16 AM  Result Value Ref Range   Sodium 138 135 - 145 mmol/L   Potassium 3.7 3.5 - 5.1 mmol/L   Chloride 108 98 - 111 mmol/L   CO2 21 (L) 22 - 32 mmol/L   Glucose, Bld 133 (H) 70 - 99 mg/dL   BUN 8 6 - 20 mg/dL   Creatinine, Ser 1.02 0.61 - 1.24 mg/dL   Calcium 8.8 (L) 8.9 - 10.3 mg/dL   GFR calc non Af Amer >60 >60 mL/min   GFR calc Af Amer >60 >60 mL/min   Anion gap 9 5 - 15    Comment: Performed at [facility], [place] [address] [place], [place] [address]  CBC WITH DIFFERENTIAL     Status: Abnormal   Collection Time: 11/14/18  4:16 AM  Result Value Ref Range   WBC 11.1 (H) 4.0 - 10.5 K/uL   RBC 4.27 4.22 - 5.81 MIL/uL   Hemoglobin 14.2 13.0 - 17.0 g/dL   HCT 42.5 39.0 - 52.0 %   MCV 99.5 80.0 - 100.0 fL   MCH 33.3 26.0 - 34.0 pg   MCHC 33.4 30.0 - 36.0 g/dL   RDW 13.0 11.5 - 15.5 %   Platelets 270 150 - 400 K/uL   nRBC 0.0 0.0 - 0.2 %   Neutrophils Relative % 51 %   Neutro Abs 5.7 1.7 - 7.7 K/uL   Lymphocytes Relative 38 %   Lymphs Abs 4.3 (H) 0.7 - 4.0 K/uL   Monocytes Relative 6 %   Monocytes Absolute 0.7 0.1 - 1.0 K/uL   Eosinophils Relative 4 %   Eosinophils Absolute 0.4 0.0 - 0.5 K/uL   Basophils Relative 1 %   Basophils Absolute 0.1 0.0 - 0.1 K/uL   Immature Granulocytes 0 %   Abs Immature Granulocytes 0.05 0.00 - 0.07 K/uL    Comment: Performed at [facility], [address] [place], [place] [address]  Troponin I - Now Then Q6H     Status: Abnormal   Collection Time: 11/14/18 10:47 AM  Result Value Ref Range   Troponin I 0.31 (HH) <0.03 ng/mL    Comment: CRITICAL VALUE NOTED.  VALUE IS CONSISTENT WITH PREVIOUSLY REPORTED AND CALLED VALUE. Performed at [facility], [place] [address] [place], [place] [address]     Dg Chest 2  View  Result Date: 11/13/2018 CLINICAL DATA:  Cardiac episode today EXAM: CHEST - 2 VIEW COMPARISON:  Mar 11, 2014 FINDINGS: The heart size and mediastinal contours are within normal limits. Both lungs are clear. The visualized skeletal structures are unremarkable. IMPRESSION: No active cardiopulmonary disease. Electronically Signed   By: Abelardo Diesel M.D.   On: 11/13/2018 19:44    Review of Systems  Constitutional: Positive for malaise/fatigue.  Eyes: Negative.   Respiratory: Negative for shortness of breath.   Cardiovascular: Positive for chest pain. Negative for palpitations, orthopnea, leg swelling and PND.  Gastrointestinal: Negative.   Genitourinary: Negative.   Musculoskeletal: Negative.   Skin: Negative.   Neurological: Negative.   Endo/Heme/Allergies: Negative.   Psychiatric/Behavioral: Negative.    Blood pressure 123/72, pulse 77, temperature 97.7 F (36.5 C), temperature source Oral, resp. rate 18, height '5\' 9"'  (1.753 m), weight 113 kg, SpO2 96 %. Physical Exam  Constitutional: He is oriented to person, place, and time.  Obese gentleman in no distress  HENT:  Head:  Normocephalic and atraumatic.  Mouth/Throat: Oropharynx is clear and moist.  Eyes: Pupils are equal, round, and reactive to light. EOM are normal.  Neck: Normal range of motion. Neck supple. No JVD present. No thyromegaly present.  Cardiovascular: Normal rate, regular rhythm, normal heart sounds and intact distal pulses.  No murmur heard. Respiratory: Effort normal and breath sounds normal. No respiratory distress.  GI: Soft. Bowel sounds are normal. He exhibits no distension. There is no abdominal tenderness.  Musculoskeletal: Normal range of motion.        General: No edema.  Lymphadenopathy:    He has no cervical adenopathy.  Neurological: He is alert and oriented to person, place, and time. He has normal strength. No cranial nerve deficit or sensory deficit.  Skin: Skin is warm and dry.  Psychiatric:  He has a normal mood and affect.     CARDIAC CATHETERIZATION REPORT  INDICATION: Non-ST elevation myocardial infarction  Interventional Cardiology Attending: Dr. Carlynn Spry  Fellow: Dr Harlow Mares  Date of Procedure: November 11, 2018  PROCEDURE:   1. IV concious sedation and continuous hemodynamic monitoring 2. Access to right femoral artery via modified Seldinger technique 3. Left heart catheterization with crossing of the aortic valve into the  left ventricle with pressures obtained 4. Bilateral selective coronary angiography 5. Selective right iliofemoral angiography for device closure possibility 6. Patent hemostasis achieved in the right femoral artery using a 6  French Angio-Seal device     Equipment - Please refer to merge system   Anesthesia: - IV concious sedation, Local analgesia with 1% lidocaine infused into the  subcutaneous tissue of the R groin   Moderate sedation services provided by Dr Carlynn Spry and other qualified  health care professionals performing the diagnostic or therapeutic service  that the sedation supports, requiring the presence of an independent  trained observer to assist in the monitoring of the patient's level of  consciousness and physiological status.   Sedation start time 1346 - End time 1406  Contrast: 90 cc   HEMODYNAMICS:  AO: 101/57/78 mmHg LVEDP: 20 mmHg LV: 100/4/19 mmHg    CORONARY ANGIOGRAPHY: Dominance: Right Left Main: The left main is a large caliber vessel that bifurcates into  the LAD and left circumflex arteries, there is a 70% stenotic lesion at  the ostium with significant drop in blood pressure when the vessel was  engaged with the catheter, TIMI-3 flow LAD: The LAD is a large caliber vessel that wraps around to the apex,  there is a 70 to 80% stenotic lesion in the proximal segment, the  remainder of the vessel has mild disease, TIMI-3 flow. There are left to  right collaterals present. Diagonals:  The first diagonal is a large caliber vessel that has 20 to 30%  stenosis LCx: Left circumflex is a large caliber vessel that has 70 to 80% stenosis  in the proximal segment, the remainder of the vessel has mild disease,  TIMI-3 flow Obtuse marginals: The first OM is a large caliber vessel with no  significant disease, the second and third OM are small-caliber vessels  with mild disease RCA: The RCA is a large caliber vessel, there is mild disease in the  proximal segment, there is diffuse disease in the midsegment with a 90%  stenotic lesion, the distal vessel has mild disease, TIMI-3 flow. There  are left to right collaterals present PDA/PLA: These are moderate caliber vessels with mild luminal  irregularities     Procedure: Patient was bought to the cardiac catheterization  lab after  obtaining informed consent. B/L groins were prepped and draped in sterile  manner. Arterial Access was obtained via R common femoral A. Using  modified Seldinger technique using micropuncture kit and ultrasound  guidance. A 6 French sheath was inserted into the right femoral artery.  A JL4 catheter was inserted through the sheath and advanced into the a  sending aorta where was used to engage the left main coronary artery.  Selective left coronary angiography was performed in multiple views.  Next, the JL4 catheter was exchanged for a JR4 catheter which was used to  engage the right coronary artery selectively. Right coronary angiography  was performed in multiple views. Next, a pigtail catheter was inserted  and used to cross the aortic valve into the left ventricle where pressures  were recorded. We then performed a limited iliofemoral angiography on the  right to determine if device closure was a possibility. A 6 French  Angio-Seal device was deployed in the right femoral artery to achieve  patent hemostasis. The patient tolerated the procedure well without any  complications. He  left the Cath Lab in stable condition return to post  cardiac cath recovery for close monitoring.     Intervention: None  Hemostasis: Once patient was deemend an appropriate candidate for closure device  placement a 6Fr Angioseal was successfully inserted into R Common Femoral  A. With excellent hemostasis achieved.    Complications:  None. At the end of the procedure the patient was transferred out of the  Cardiac catheterization laboratory in hemodynamically and clinically  stable condition.  CONCLUSION: 1. Significant multivessel coronary artery disease as described above  including an 80% stenotic lesion in the proximal LAD, 80% stenotic lesion  in the proximal left circumflex, and 90% stenotic lesion in the mid RCA 2. Hypertension 3. Nicotine abuse  RECOMMENDATION: 1. We discussed with the patient the need for CABG. We will consult  cardiothoracic surgery, Dr. Vira Blanco, to evaluate the patient 2. Continue the heparin 3. Obtain carotid Dopplers 4. High intensity statin 5. Hold antiplatelets until we know plan for possible surgery 6. Monitor closely and post cath recovery and transfer back to the floor  Discussed with Attending, Dr. Jon Gills [facility] Interventional cardiology fellow 11/11/2018   Assessment/Plan:  This 58 year old gentleman has by report 70% ostial left main and severe three-vessel coronary disease with normal left ventricular systolic function.  He presented in [place] with a non-ST segment elevation MI Sunday a week ago and has had no chest pain since.  Based on the cath report I agree that coronary artery bypass graft surgery is indicated in this patient.  Cardiology is trying to obtain a copy of the catheterization films from the hospital in [place] and hopefully that can happen tomorrow or Tuesday.  I would not be able to do surgery until Thursday this week.  He has been asymptomatic over the past week and I think it is okay for  him to go home until surgery as long as he is not exerting himself. I discussed the operative procedure with the patient including alternatives, benefits and risks; including but not limited to bleeding, blood transfusion, infection, stroke, myocardial infarction, graft failure, heart block requiring a permanent pacemaker, organ dysfunction, and death.  Gabriel Green understands and agrees to proceed.  We will schedule surgery for Thursday this week.  I will have my office call him tomorrow to set up surgery.  I spent 40 minutes performing this consultation and > 50% of  this time was spent face to face counseling and coordinating the care of this patient's left main and severe multivessel coronary disease.  Gaye Pollack 11/15/2018, 3:25 PM

## 2018-11-15 NOTE — Progress Notes (Signed)
Patient asking if going home, states he has packed his bags and wants to know when he can go. Paged PA Meng to find out if patient to be d/ced today.

## 2018-11-15 NOTE — Progress Notes (Signed)
ANTICOAGULATION CONSULT NOTE - Follow-Up Consult  Pharmacy Consult for lovenox Indication: chest pain/ACS  No Known Allergies  Patient Measurements: Weight: 249 lb 1.6 oz (113 kg) Heparin Dosing Weight:   Vital Signs: Temp: 97.7 F (36.5 C) (01/19 0437) Temp Source: Oral (01/19 0437) BP: 123/72 (01/19 0908) Pulse Rate: 77 (01/19 0908)  Labs: Recent Labs    11/13/18 1833 11/13/18 2211 11/14/18 0416 11/14/18 1047  HGB 14.5  --  14.2  --   HCT 45.4  --  42.5  --   PLT 269  --  270  --   CREATININE 1.17  --  1.02  --   TROPONINI  --  0.52* 0.45* 0.31*    CrCl cannot be calculated (Unknown ideal weight.).   Medical History: Past Medical History:  Diagnosis Date  . Alcohol use   . Borderline diabetes    a. 10/2018 HbA1c = 6.3.  . CAD (coronary artery disease)    a. 11/10/2018 NSTEMI/Cath [facility], [place]): LM 70ost (dampening), LAD 70-80p, D1 20-30, LCX 70-80p, RCA 59m w/ L->R collats, RPDA/RPL min irregs.  . History of echocardiogram    a. 11/10/2017 Echo: Nl LV fxn. Triv MR/TR.  Marland Kitchen Hypertension   . Morbid obesity (HCC)   . Tobacco abuse     Assessment: 46 yom presented to the ED with CP. To start therapeutic lovenox. Baseline H/H and platelets are WNL. He is not on anticoagulation PTA.   Goal of Therapy:  Anti-Xa level 0.6-1 units/ml 4hrs after LMWH dose given Monitor platelets by anticoagulation protocol: Yes   Plan:  Lovenox 110mg  SQ Q12H CBC Q72H F/u plans for surgery  Fredonia Highland, PharmD, BCPS Clinical Pharmacist [phone] Please check AMION for all [facility] Pharmacy numbers 11/15/2018

## 2018-11-15 NOTE — Progress Notes (Signed)
DAILY PROGRESS NOTE   Patient Name: Gabriel Green Date of Encounter: 11/15/2018  Chief Complaint   No issues overnight  Patient Profile   58 yo male with recent chest pain and NSTEMI, found to have multivessel CAD while working in [place], [place] - elected to return to [facility] for CABG evaluation.  Subjective   Unable to obtain cath films until Monday - case discussed with CT surgery yesterday, but the patient says he has not been seen yet. He wants to go home if no plans for surgery in the next few days.  Objective   Vitals:   11/14/18 1412 11/14/18 2115 11/15/18 0437 11/15/18 0908  BP: 116/82 129/76 119/76 123/72  Pulse: 81 73 67 77  Resp: '18 20 18   ' Temp: 98.2 F (36.8 C) 98.3 F (36.8 C) 97.7 F (36.5 C)   TempSrc: Oral Oral Oral   SpO2: 98% 97% 96%   Weight:   113 kg     Intake/Output Summary (Last 24 hours) at 11/15/2018 1052 Last data filed at 11/15/2018 0941 Gross per 24 hour  Intake 1260 ml  Output -  Net 1260 ml   Filed Weights   11/14/18 0418 11/15/18 0437  Weight: 112.7 kg 113 kg    Physical Exam   General appearance: alert and no distress Neck: no carotid bruit, no JVD and thyroid not enlarged, symmetric, no tenderness/mass/nodules Lungs: clear to auscultation bilaterally Heart: regular rate and rhythm, S1, S2 normal, no murmur, click, rub or gallop Abdomen: moderately obese Extremities: extremities normal, atraumatic, no cyanosis or edema Pulses: 2+ and symmetric Skin: Skin color, texture, turgor normal. No rashes or lesions Neurologic: Grossly normal Psych: Pleasant  Inpatient Medications    Scheduled Meds: . aspirin EC  81 mg Oral Daily  . atorvastatin  80 mg Oral q1800  . carvedilol  3.125 mg Oral BID WC  . enoxaparin (LOVENOX) injection  110 mg Subcutaneous Q12H  . nicotine  21 mg Transdermal Daily  . nitroGLYCERIN  1 inch Topical Q6H  . pantoprazole  40 mg Oral Q0600    Continuous Infusions:   PRN Meds: acetaminophen, nitroGLYCERIN,  ondansetron (ZOFRAN) IV   Labs   Results for orders placed or performed during the hospital encounter of 11/13/18 (from the past 48 hour(s))  Basic metabolic panel     Status: Abnormal   Collection Time: 11/13/18  6:33 PM  Result Value Ref Range   Sodium 139 135 - 145 mmol/L   Potassium 3.6 3.5 - 5.1 mmol/L   Chloride 107 98 - 111 mmol/L   CO2 22 22 - 32 mmol/L   Glucose, Bld 129 (H) 70 - 99 mg/dL   BUN 7 6 - 20 mg/dL   Creatinine, Ser 1.17 0.61 - 1.24 mg/dL   Calcium 9.0 8.9 - 10.3 mg/dL   GFR calc non Af Amer >60 >60 mL/min   GFR calc Af Amer >60 >60 mL/min   Anion gap 10 5 - 15    Comment: Performed at [facility], [place] [address] [place], [place] [address]  CBC     Status: Abnormal   Collection Time: 11/13/18  6:33 PM  Result Value Ref Range   WBC 11.8 (H) 4.0 - 10.5 K/uL   RBC 4.56 4.22 - 5.81 MIL/uL   Hemoglobin 14.5 13.0 - 17.0 g/dL   HCT 45.4 39.0 - 52.0 %   MCV 99.6 80.0 - 100.0 fL   MCH 31.8 26.0 - 34.0 pg   MCHC 31.9  30.0 - 36.0 g/dL   RDW 12.7 11.5 - 15.5 %   Platelets 269 150 - 400 K/uL   nRBC 0.0 0.0 - 0.2 %    Comment: Performed at [facility], [place] [address], [place], [place] [address]  I-stat troponin, ED     Status: Abnormal   Collection Time: 11/13/18  7:02 PM  Result Value Ref Range   Troponin i, poc 0.50 (HH) 0.00 - 0.08 ng/mL   Comment 3            Comment: Due to the release kinetics of cTnI, a negative result within the first hours of the onset of symptoms does not rule out myocardial infarction with certainty. If myocardial infarction is still suspected, repeat the test at appropriate intervals.   Troponin I - Now Then Q6H     Status: Abnormal   Collection Time: 11/13/18 10:11 PM  Result Value Ref Range   Troponin I 0.52 (HH) <0.03 ng/mL    Comment: CRITICAL RESULT CALLED TO, READ BACK BY AND VERIFIED WITH: HELEM M,RN 11/13/18 2322 WAYK Performed at [facility], [place] [address] [place], [place] [address]   Troponin I  - Now Then Q6H     Status: Abnormal   Collection Time: 11/14/18  4:16 AM  Result Value Ref Range   Troponin I 0.45 (HH) <0.03 ng/mL    Comment: CRITICAL VALUE NOTED.  VALUE IS CONSISTENT WITH PREVIOUSLY REPORTED AND CALLED VALUE. Performed at [facility], [place] [address], [place], [place] [address]   Basic metabolic panel     Status: Abnormal   Collection Time: 11/14/18  4:16 AM  Result Value Ref Range   Sodium 138 135 - 145 mmol/L   Potassium 3.7 3.5 - 5.1 mmol/L   Chloride 108 98 - 111 mmol/L   CO2 21 (L) 22 - 32 mmol/L   Glucose, Bld 133 (H) 70 - 99 mg/dL   BUN 8 6 - 20 mg/dL   Creatinine, Ser 1.02 0.61 - 1.24 mg/dL   Calcium 8.8 (L) 8.9 - 10.3 mg/dL   GFR calc non Af Amer >60 >60 mL/min   GFR calc Af Amer >60 >60 mL/min   Anion gap 9 5 - 15    Comment: Performed at [place] [address], [place], [place] [address]  CBC WITH DIFFERENTIAL     Status: Abnormal   Collection Time: 11/14/18  4:16 AM  Result Value Ref Range   WBC 11.1 (H) 4.0 - 10.5 K/uL   RBC 4.27 4.22 - 5.81 MIL/uL   Hemoglobin 14.2 13.0 - 17.0 g/dL   HCT 42.5 39.0 - 52.0 %   MCV 99.5 80.0 - 100.0 fL   MCH 33.3 26.0 - 34.0 pg   MCHC 33.4 30.0 - 36.0 g/dL   RDW 13.0 11.5 - 15.5 %   Platelets 270 150 - 400 K/uL   nRBC 0.0 0.0 - 0.2 %   Neutrophils Relative % 51 %   Neutro Abs 5.7 1.7 - 7.7 K/uL   Lymphocytes Relative 38 %   Lymphs Abs 4.3 (H) 0.7 - 4.0 K/uL   Monocytes Relative 6 %   Monocytes Absolute 0.7 0.1 - 1.0 K/uL   Eosinophils Relative 4 %   Eosinophils Absolute 0.4 0.0 - 0.5 K/uL   Basophils Relative 1 %   Basophils Absolute 0.1 0.0 - 0.1 K/uL   Immature Granulocytes 0 %   Abs Immature Granulocytes 0.05 0.00 - 0.07 K/uL  Comment: Performed at [facility], [place] [address] [place], [place] [address]  Troponin I - Now Then Q6H     Status: Abnormal   Collection Time: 11/14/18 10:47 AM  Result Value Ref Range   Troponin I 0.31 (HH) <0.03 ng/mL    Comment: CRITICAL VALUE  NOTED.  VALUE IS CONSISTENT WITH PREVIOUSLY REPORTED AND CALLED VALUE. Performed at [facility], [place] [address] [place], [place] [address]     ECG   N/A  Telemetry   Sinus rhythm - Personally Reviewed  Radiology    Dg Chest 2 View  Result Date: 11/13/2018 CLINICAL DATA:  Cardiac episode today EXAM: CHEST - 2 VIEW COMPARISON:  Mar 11, 2014 FINDINGS: The heart size and mediastinal contours are within normal limits. Both lungs are clear. The visualized skeletal structures are unremarkable. IMPRESSION: No active cardiopulmonary disease. Electronically Signed   By: Abelardo Diesel M.D.   On: 11/13/2018 19:44    Cardiac Studies   CARDIAC CATHETERIZATION REPORT  INDICATION: Non-ST elevation myocardial infarction  Interventional Cardiology Attending: Dr. Carlynn Spry  Fellow: Dr Harlow Mares  Date of Procedure: November 11, 2018  PROCEDURE:   1. IV concious sedation and continuous hemodynamic monitoring 2. Access to right femoral artery via modified Seldinger technique 3. Left heart catheterization with crossing of the aortic valve into the  left ventricle with pressures obtained 4. Bilateral selective coronary angiography 5. Selective right iliofemoral angiography for device closure possibility 6. Patent hemostasis achieved in the right femoral artery using a 6  French Angio-Seal device     Equipment - Please refer to merge system   Anesthesia: - IV concious sedation, Local analgesia with 1% lidocaine infused into the  subcutaneous tissue of the R groin   Moderate sedation services provided by Dr Carlynn Spry and other qualified  health care professionals performing the diagnostic or therapeutic service  that the sedation supports, requiring the presence of an independent  trained observer to assist in the monitoring of the patient's level of  consciousness and physiological status.   Sedation start time 1346 - End time 1406  Contrast: 90 cc   HEMODYNAMICS:   AO: 101/57/78 mmHg LVEDP: 20 mmHg LV: 100/4/19 mmHg    CORONARY ANGIOGRAPHY: Dominance: Right Left Main: The left main is a large caliber vessel that bifurcates into  the LAD and left circumflex arteries, there is a 70% stenotic lesion at  the ostium with significant drop in blood pressure when the vessel was  engaged with the catheter, TIMI-3 flow LAD: The LAD is a large caliber vessel that wraps around to the apex,  there is a 70 to 80% stenotic lesion in the proximal segment, the  remainder of the vessel has mild disease, TIMI-3 flow. There are left to  right collaterals present. Diagonals: The first diagonal is a large caliber vessel that has 20 to 30%  stenosis LCx: Left circumflex is a large caliber vessel that has 70 to 80% stenosis  in the proximal segment, the remainder of the vessel has mild disease,  TIMI-3 flow Obtuse marginals: The first OM is a large caliber vessel with no  significant disease, the second and third OM are small-caliber vessels  with mild disease RCA: The RCA is a large caliber vessel, there is mild disease in the  proximal segment, there is diffuse disease in the midsegment with a 90%  stenotic lesion, the distal vessel has mild disease, TIMI-3 flow. There  are left to right collaterals present PDA/PLA: These  are moderate caliber vessels with mild luminal  irregularities     Procedure: Patient was bought to the cardiac catheterization lab after  obtaining informed consent. B/L groins were prepped and draped in sterile  manner. Arterial Access was obtained via R common femoral A. Using  modified Seldinger technique using micropuncture kit and ultrasound  guidance. A 6 French sheath was inserted into the right femoral artery.  A JL4 catheter was inserted through the sheath and advanced into the a  sending aorta where was used to engage the left main coronary artery.  Selective left coronary angiography was performed in multiple views.   Next, the JL4 catheter was exchanged for a JR4 catheter which was used to  engage the right coronary artery selectively. Right coronary angiography  was performed in multiple views. Next, a pigtail catheter was inserted  and used to cross the aortic valve into the left ventricle where pressures  were recorded. We then performed a limited iliofemoral angiography on the  right to determine if device closure was a possibility. A 6 French  Angio-Seal device was deployed in the right femoral artery to achieve  patent hemostasis. The patient tolerated the procedure well without any  complications. He left the Cath Lab in stable condition return to post  cardiac cath recovery for close monitoring.     Intervention: None  Hemostasis: Once patient was deemend an appropriate candidate for closure device  placement a 6Fr Angioseal was successfully inserted into R Common Femoral  A. With excellent hemostasis achieved.    Complications:  None. At the end of the procedure the patient was transferred out of the  Cardiac catheterization laboratory in hemodynamically and clinically  stable condition.  CONCLUSION: 1. Significant multivessel coronary artery disease as described above  including an 80% stenotic lesion in the proximal LAD, 80% stenotic lesion  in the proximal left circumflex, and 90% stenotic lesion in the mid RCA 2. Hypertension 3. Nicotine abuse  RECOMMENDATION: 1. We discussed with the patient the need for CABG. We will consult  cardiothoracic surgery, Dr. Vira Blanco, to evaluate the patient 2. Continue the heparin 3. Obtain carotid Dopplers 4. High intensity statin 5. Hold antiplatelets until we know plan for possible surgery 6. Monitor closely and post cath recovery and transfer back to the floor  Discussed with Attending, Dr. Jon Gills [facility] Interventional cardiology fellow 11/11/2018  Assessment   Principal Problem:   NSTEMI (non-ST  elevated myocardial infarction) [facility]) Active Problems:   CAD, multiple vessel   Plan   No symptoms at this point. On therapeutic lovenox. Await CT surgery evaluation - will not likely have cath films until Monday. I'm considering sending him home with CT surgery approval and if we have a definitive operative plan in place. Will reach out to surgery again for recommendations.  Time Spent Directly with Patient:  I have spent a total of 25 minutes with the patient reviewing hospital notes, telemetry, EKGs, labs and examining the patient as well as establishing an assessment and plan that was discussed personally with the patient.  > 50% of time was spent in direct patient care.  Length of Stay:  LOS: 2 days   Pixie Casino, MD, [facility], [place] Director of the Advanced Lipid Disorders &  Cardiovascular Risk Reduction Clinic Diplomate of the American Board of Clinical Lipidology Attending Cardiologist  Direct Dial: [phone]  Fax: [phone]  Website:  [email or web address]  11/15/2018, 10:52 AM

## 2018-11-16 ENCOUNTER — Other Ambulatory Visit: Payer: Self-pay | Admitting: *Deleted

## 2018-11-16 DIAGNOSIS — I2510 Atherosclerotic heart disease of native coronary artery without angina pectoris: Secondary | ICD-10-CM

## 2018-11-16 NOTE — Pre-Procedure Instructions (Signed)
Gabriel Green  11/16/2018      [id number] [place], [place] - [address] AT [facility] [address] [place] [address] Phone: [phone] Fax: [phone]    Your procedure is scheduled on 11/19/2018.  Report to [facility] Admitting at 0530 A.M.  Call this number if you have problems the morning of surgery:  [phone]   Remember:  Do not eat or drink after midnight.    Take these medicines the morning of surgery with A SIP OF WATER: Carvedilol (Coreg) Nitroglycerin (Nitrostat) - if needed  7 days prior to surgery STOP taking Aleve, Naproxen, Ibuprofen, Motrin, Advil, Goody's, BC's, all herbal medications, fish oil, and all vitamins.  Follow your surgeon's instructions on when to stop Asprin.  If no instructions were given by your surgeon then you will need to call the office to get those instructions.       Do not wear jewelry.  Do not wear lotions, powders, or colognes, or deodorant.  Men may shave face and neck.  Do not bring valuables to the hospital.  [facility] is not responsible for any belongings or valuables.  Contacts, eyeglasses, hearing aids, dentures or bridgework may not be worn into surgery.  Leave your suitcase in the car.  After surgery it may be brought to your room.  For patients admitted to the hospital, discharge time will be determined by your treatment team.  Patients discharged the day of surgery will not be allowed to drive home.   Name and phone number of your driver:    Special instructions:   [place]- Preparing For Surgery  Before surgery, you can play an important role. Because skin is not sterile, your skin needs to be as free of germs as possible. You can reduce the number of germs on your skin by washing with CHG (chlorahexidine gluconate) Soap before surgery.  CHG is an antiseptic cleaner which kills germs and bonds with the skin to continue killing germs even after  washing.    Oral Hygiene is also important to reduce your risk of infection.  Remember - BRUSH YOUR TEETH THE MORNING OF SURGERY WITH YOUR REGULAR TOOTHPASTE  Please do not use if you have an allergy to CHG or antibacterial soaps. If your skin becomes reddened/irritated stop using the CHG.  Do not shave (including legs and underarms) for at least 48 hours prior to first CHG shower. It is OK to shave your face.  Please follow these instructions carefully.   1. Shower the NIGHT BEFORE SURGERY and the MORNING OF SURGERY with CHG.   2. If you chose to wash your hair, wash your hair first as usual with your normal shampoo.  3. After you shampoo, rinse your hair and body thoroughly to remove the shampoo.  4. Use CHG as you would any other liquid soap. You can apply CHG directly to the skin and wash gently with a scrungie or a clean washcloth.   5. Apply the CHG Soap to your body ONLY FROM THE NECK DOWN.  Do not use on open wounds or open sores. Avoid contact with your eyes, ears, mouth and genitals (private parts). Wash Face and genitals (private parts)  with your normal soap.  6. Wash thoroughly, paying special attention to the area where your surgery will be performed.  7. Thoroughly rinse your body with warm water from the neck down.  8. DO NOT shower/wash with  your normal soap after using and rinsing off the CHG Soap.  9. Pat yourself dry with a CLEAN TOWEL.  10. Wear CLEAN PAJAMAS to bed the night before surgery, wear comfortable clothes the morning of surgery  11. Place CLEAN SHEETS on your bed the night of your first shower and DO NOT SLEEP WITH PETS.    Day of Surgery: Shower as stated above. Do not apply any deodorants/lotions.  Please wear clean clothes to the hospital/surgery center.   Remember to brush your teeth WITH YOUR REGULAR TOOTHPASTE.    Please read over the following fact sheets that you were given.

## 2018-11-17 ENCOUNTER — Other Ambulatory Visit: Payer: Self-pay

## 2018-11-17 ENCOUNTER — Other Ambulatory Visit (HOSPITAL_COMMUNITY): Payer: 59

## 2018-11-17 ENCOUNTER — Encounter (HOSPITAL_COMMUNITY): Payer: Self-pay

## 2018-11-17 ENCOUNTER — Ambulatory Visit (HOSPITAL_BASED_OUTPATIENT_CLINIC_OR_DEPARTMENT_OTHER)
Admission: RE | Admit: 2018-11-17 | Discharge: 2018-11-17 | Disposition: A | Discharge status: Home / Self Care | Payer: 59 | Source: Ambulatory Visit | Attending: Surgery | Admitting: Surgery

## 2018-11-17 ENCOUNTER — Encounter (HOSPITAL_COMMUNITY)
Admission: RE | Admit: 2018-11-17 | Discharge: 2018-11-17 | Disposition: A | Discharge status: Home / Self Care | Payer: 59 | Source: Ambulatory Visit

## 2018-11-17 ENCOUNTER — Ambulatory Visit (HOSPITAL_COMMUNITY)
Admission: RE | Admit: 2018-11-17 | Discharge: 2018-11-17 | Disposition: A | Discharge status: Home / Self Care | Payer: 59 | Source: Ambulatory Visit | Attending: Surgery | Admitting: Surgery

## 2018-11-17 DIAGNOSIS — I2510 Atherosclerotic heart disease of native coronary artery without angina pectoris: Secondary | ICD-10-CM

## 2018-11-17 HISTORY — DX: Dyspnea, unspecified: R06.00

## 2018-11-17 HISTORY — DX: Personal history of urinary calculi: Z87.442

## 2018-11-17 LAB — CBC
HCT: 47.6 % (ref 39.0–52.0)
Hemoglobin: 15.3 g/dL (ref 13.0–17.0)
MCH: 32.5 pg (ref 26.0–34.0)
MCHC: 32.1 g/dL (ref 30.0–36.0)
MCV: 101.1 fL — ABNORMAL HIGH (ref 80.0–100.0)
PLATELETS: 272 10*3/uL (ref 150–400)
RBC: 4.71 MIL/uL (ref 4.22–5.81)
RDW: 12.6 % (ref 11.5–15.5)
WBC: 11.7 10*3/uL — ABNORMAL HIGH (ref 4.0–10.5)
nRBC: 0 % (ref 0.0–0.2)

## 2018-11-17 LAB — BLOOD GAS, ARTERIAL
Acid-base deficit: 0.3 mmol/L (ref 0.0–2.0)
Bicarbonate: 23.5 mmol/L (ref 20.0–28.0)
Drawn by: 265211
FIO2: 21
O2 Saturation: 95.9 %
Patient temperature: 98.6
pCO2 arterial: 36.8 mmHg (ref 32.0–48.0)
pH, Arterial: 7.422 (ref 7.350–7.450)
pO2, Arterial: 77.5 mmHg — ABNORMAL LOW (ref 83.0–108.0)

## 2018-11-17 LAB — PULMONARY FUNCTION TEST
DL/VA % pred: 79 %
DL/VA: 3.65 ml/min/mmHg/L
DLCO cor % pred: 70 %
DLCO cor: 21.87 ml/min/mmHg
DLCO unc % pred: 71 %
DLCO unc: 22.29 ml/min/mmHg
FEF 25-75 Post: 5.55 L/sec
FEF 25-75 Pre: 5 L/sec
FEF2575-%Change-Post: 11 %
FEF2575-%Pred-Post: 177 %
FEF2575-%Pred-Pre: 159 %
FEV1-%CHANGE-POST: 3 %
FEV1-%PRED-PRE: 102 %
FEV1-%Pred-Post: 105 %
FEV1-Post: 3.85 L
FEV1-Pre: 3.73 L
FEV1FVC-%Change-Post: 1 %
FEV1FVC-%Pred-Pre: 114 %
FEV6-%Change-Post: 2 %
FEV6-%PRED-POST: 94 %
FEV6-%Pred-Pre: 92 %
FEV6-Post: 4.32 L
FEV6-Pre: 4.23 L
FEV6FVC-%Pred-Post: 104 %
FEV6FVC-%Pred-Pre: 104 %
FVC-%Change-Post: 2 %
FVC-%Pred-Post: 90 %
FVC-%Pred-Pre: 89 %
FVC-Post: 4.32 L
FVC-Pre: 4.23 L
Post FEV1/FVC ratio: 89 %
Post FEV6/FVC ratio: 100 %
Pre FEV1/FVC ratio: 88 %
Pre FEV6/FVC Ratio: 100 %
RV % PRED: 111 %
RV: 2.32 L
TLC % pred: 97 %
TLC: 6.62 L

## 2018-11-17 LAB — URINALYSIS, ROUTINE W REFLEX MICROSCOPIC
Bilirubin Urine: NEGATIVE
Glucose, UA: 150 mg/dL — AB
Hgb urine dipstick: NEGATIVE
Ketones, ur: NEGATIVE mg/dL
Leukocytes, UA: NEGATIVE
Nitrite: NEGATIVE
Protein, ur: NEGATIVE mg/dL
Specific Gravity, Urine: 1.014 (ref 1.005–1.030)
pH: 5 (ref 5.0–8.0)

## 2018-11-17 LAB — SURGICAL PCR SCREEN
MRSA, PCR: NEGATIVE
Staphylococcus aureus: NEGATIVE

## 2018-11-17 LAB — COMPREHENSIVE METABOLIC PANEL
ALT: 29 U/L (ref 0–44)
AST: 34 U/L (ref 15–41)
Albumin: 3.5 g/dL (ref 3.5–5.0)
Alkaline Phosphatase: 141 U/L — ABNORMAL HIGH (ref 38–126)
Anion gap: 10 (ref 5–15)
BILIRUBIN TOTAL: 0.6 mg/dL (ref 0.3–1.2)
BUN: 8 mg/dL (ref 6–20)
CO2: 19 mmol/L — ABNORMAL LOW (ref 22–32)
CREATININE: 0.88 mg/dL (ref 0.61–1.24)
Calcium: 9.1 mg/dL (ref 8.9–10.3)
Chloride: 108 mmol/L (ref 98–111)
GFR calc Af Amer: >60 mL/min (ref >60)
Glucose, Bld: 101 mg/dL — ABNORMAL HIGH (ref 70–99)
Potassium: 4.2 mmol/L (ref 3.5–5.1)
Sodium: 137 mmol/L (ref 135–145)
Total Protein: 7.3 g/dL (ref 6.5–8.1)

## 2018-11-17 LAB — TYPE AND SCREEN
ABO/RH(D): O POS
Antibody Screen: NEGATIVE

## 2018-11-17 LAB — APTT: aPTT: 39 seconds — ABNORMAL HIGH (ref 24–36)

## 2018-11-17 LAB — HEMOGLOBIN A1C
Hgb A1c MFr Bld: 6.5 % — ABNORMAL HIGH (ref 4.8–5.6)
Mean Plasma Glucose: 139.85 mg/dL

## 2018-11-17 LAB — ABO/RH: ABO/RH(D): O POS

## 2018-11-17 LAB — PROTIME-INR
INR: 0.99
Prothrombin Time: 13 seconds (ref 11.4–15.2)

## 2018-11-17 MED ORDER — ALBUTEROL SULFATE (2.5 MG/3ML) 0.083% IN NEBU
2.5000 mg | INHALATION_SOLUTION | Freq: Once | RESPIRATORY_TRACT | Status: AC
  Administered 2018-11-17: 2.5 mg via RESPIRATORY_TRACT

## 2018-11-17 NOTE — Progress Notes (Signed)
Spoke with Alycia Rossetti about abnormal lab results, she will let Dr. Laneta Simmers know

## 2018-11-17 NOTE — Progress Notes (Signed)
PCP - denies Cardiologist - Dr. Donato Schultz  Chest x-ray - 11/13/2018 EKG - 11/14/2018 Stress Test -  denies ECHO - 11/10/2018 Cardiac Cath - 11/11/2018  Sleep Study - denies CPAP - N/A  Blood Thinner Instructions: N/A Aspirin Instructions: Confirmed with Alycia Rossetti take until DOS, not morning of surgery  Anesthesia review: Yes, recent cardiac testing  Patient denies shortness of breath, fever, cough and chest pain at PAT appointment  Patient verbalized understanding of instructions that were given to them at the PAT appointment. Patient was also instructed that they will need to review over the PAT instructions again at home before surgery.

## 2018-11-18 MED ORDER — DOPAMINE-DEXTROSE 3.2-5 MG/ML-% IV SOLN
0.0000 ug/kg/min | INTRAVENOUS | Status: DC
  Filled 2018-11-18: qty 250

## 2018-11-18 MED ORDER — MILRINONE LACTATE IN DEXTROSE 20-5 MG/100ML-% IV SOLN
0.3000 ug/kg/min | INTRAVENOUS | Status: DC
  Filled 2018-11-18: qty 100

## 2018-11-18 MED ORDER — SODIUM CHLORIDE 0.9 % IV SOLN
750.0000 mg | INTRAVENOUS | Status: DC
  Filled 2018-11-18: qty 750

## 2018-11-18 MED ORDER — POTASSIUM CHLORIDE 2 MEQ/ML IV SOLN
80.0000 meq | INTRAVENOUS | Status: DC
  Filled 2018-11-18: qty 40

## 2018-11-18 MED ORDER — TRANEXAMIC ACID (OHS) PUMP PRIME SOLUTION
2.0000 mg/kg | INTRAVENOUS | Status: DC
  Filled 2018-11-18: qty 2.24

## 2018-11-18 MED ORDER — NITROGLYCERIN IN D5W 200-5 MCG/ML-% IV SOLN
2.0000 ug/min | INTRAVENOUS | Status: DC
  Filled 2018-11-18: qty 250

## 2018-11-18 MED ORDER — SODIUM CHLORIDE 0.9 % IV SOLN
INTRAVENOUS | Status: DC
  Filled 2018-11-18: qty 30

## 2018-11-18 MED ORDER — DEXMEDETOMIDINE HCL IN NACL 400 MCG/100ML IV SOLN
0.1000 ug/kg/h | INTRAVENOUS | Status: DC
  Filled 2018-11-18: qty 100

## 2018-11-18 MED ORDER — TRANEXAMIC ACID (OHS) BOLUS VIA INFUSION
15.0000 mg/kg | INTRAVENOUS | Status: DC
  Filled 2018-11-18: qty 1677

## 2018-11-18 MED ORDER — PHENYLEPHRINE HCL-NACL 20-0.9 MG/250ML-% IV SOLN
30.0000 ug/min | INTRAVENOUS | Status: DC
  Filled 2018-11-18: qty 250

## 2018-11-18 MED ORDER — EPINEPHRINE PF 1 MG/ML IJ SOLN
0.0000 ug/min | INTRAVENOUS | Status: DC
  Filled 2018-11-18: qty 4

## 2018-11-18 MED ORDER — TRANEXAMIC ACID 1000 MG/10ML IV SOLN
1.5000 mg/kg/h | INTRAVENOUS | Status: DC
  Filled 2018-11-18: qty 25

## 2018-11-18 MED ORDER — VANCOMYCIN HCL 10 G IV SOLR
1500.0000 mg | INTRAVENOUS | Status: DC
  Filled 2018-11-18: qty 1500

## 2018-11-18 MED ORDER — SODIUM CHLORIDE 0.9 % IV SOLN
1.5000 g | INTRAVENOUS | Status: DC
  Filled 2018-11-18: qty 1.5

## 2018-11-18 MED ORDER — NOREPINEPHRINE-SODIUM CHLORIDE 4-0.9 MG/250ML-% IV SOLN
0.0000 ug/min | INTRAVENOUS | Status: DC
  Filled 2018-11-18: qty 250

## 2018-11-18 MED ORDER — INSULIN REGULAR(HUMAN) IN NACL 100-0.9 UT/100ML-% IV SOLN
INTRAVENOUS | Status: DC
  Filled 2018-11-18: qty 100

## 2018-11-18 MED ORDER — PLASMA-LYTE 148 IV SOLN
INTRAVENOUS | Status: DC
  Filled 2018-11-18: qty 2.5

## 2018-11-18 MED ORDER — MAGNESIUM SULFATE 50 % IJ SOLN
40.0000 meq | INTRAMUSCULAR | Status: DC
  Filled 2018-11-18: qty 9.85

## 2018-11-18 NOTE — Progress Notes (Signed)
TCTS:  The patient's cath films have been requested from [facility] in [place].  They have still not arrived.  My office contacted them today and apparently they put them in the regular mail instead of sending them overnight express.  At this point we do not have any cath films and will have to delay his surgery.  We will tentatively schedule this for Friday but if the cath films do not arrive then we will have to wait until Tuesday of next week.  If we do not get the cath films then he will need to be re-cath.

## 2018-11-20 ENCOUNTER — Telehealth: Payer: Self-pay | Admitting: *Deleted

## 2018-11-20 NOTE — Telephone Encounter (Signed)
Spoke with Gabriel Green at ext [phone] who is having cath films from 11/11/2018 over-nighted to [address] [place], [place]  Attn: Cath lab/[facility] via [employer]. (gave her our [employer] account #) She reports she mailed a copy on Monday however it has not reached the cath lab as of yet.  She was extremely helpful and was going to take the disc to shipping at 4:02 pm when we spoke.  Dr Anne Fu aware.

## 2018-11-23 MED ORDER — SODIUM CHLORIDE 0.9 % IV SOLN
INTRAVENOUS | Status: AC
  Filled 2018-11-23: qty 30

## 2018-11-23 MED ORDER — NITROGLYCERIN IN D5W 200-5 MCG/ML-% IV SOLN
2.0000 ug/min | INTRAVENOUS | Status: AC
  Filled 2018-11-23: qty 250

## 2018-11-23 MED ORDER — MAGNESIUM SULFATE 50 % IJ SOLN
40.0000 meq | INTRAMUSCULAR | Status: AC
  Filled 2018-11-23: qty 9.85

## 2018-11-23 MED ORDER — NOREPINEPHRINE-SODIUM CHLORIDE 4-0.9 MG/250ML-% IV SOLN
0.0000 ug/min | INTRAVENOUS | Status: AC
  Filled 2018-11-23: qty 250

## 2018-11-23 MED ORDER — TRANEXAMIC ACID (OHS) PUMP PRIME SOLUTION
2.0000 mg/kg | INTRAVENOUS | Status: AC
  Filled 2018-11-23: qty 2.24

## 2018-11-23 MED ORDER — POTASSIUM CHLORIDE 2 MEQ/ML IV SOLN
80.0000 meq | INTRAVENOUS | Status: AC
  Filled 2018-11-23: qty 40

## 2018-11-23 MED ORDER — PHENYLEPHRINE HCL-NACL 20-0.9 MG/250ML-% IV SOLN
30.0000 ug/min | INTRAVENOUS | Status: AC
  Filled 2018-11-23: qty 250

## 2018-11-23 MED ORDER — EPINEPHRINE PF 1 MG/ML IJ SOLN
0.0000 ug/min | INTRAVENOUS | Status: AC
  Filled 2018-11-23: qty 4

## 2018-11-23 MED ORDER — DOPAMINE-DEXTROSE 3.2-5 MG/ML-% IV SOLN
0.0000 ug/kg/min | INTRAVENOUS | Status: AC
  Filled 2018-11-23: qty 250

## 2018-11-23 MED ORDER — MILRINONE LACTATE IN DEXTROSE 20-5 MG/100ML-% IV SOLN
0.3000 ug/kg/min | INTRAVENOUS | Status: AC
  Filled 2018-11-23: qty 100

## 2018-11-23 MED ORDER — PLASMA-LYTE 148 IV SOLN
INTRAVENOUS | Status: AC
  Filled 2018-11-23: qty 2.5

## 2018-11-23 MED ORDER — INSULIN REGULAR(HUMAN) IN NACL 100-0.9 UT/100ML-% IV SOLN
INTRAVENOUS | Status: AC
  Filled 2018-11-23: qty 100

## 2018-11-23 MED ORDER — VANCOMYCIN HCL 10 G IV SOLR
1500.0000 mg | INTRAVENOUS | Status: AC
  Filled 2018-11-23: qty 1500

## 2018-11-23 MED ORDER — DEXMEDETOMIDINE HCL IN NACL 400 MCG/100ML IV SOLN
0.1000 ug/kg/h | INTRAVENOUS | Status: AC
  Filled 2018-11-23: qty 100

## 2018-11-23 MED ORDER — TRANEXAMIC ACID 1000 MG/10ML IV SOLN
1.5000 mg/kg/h | INTRAVENOUS | Status: AC
  Filled 2018-11-23: qty 25

## 2018-11-23 MED ORDER — SODIUM CHLORIDE 0.9 % IV SOLN
1.5000 g | INTRAVENOUS | Status: AC
  Filled 2018-11-23: qty 1.5

## 2018-11-23 MED ORDER — SODIUM CHLORIDE 0.9 % IV SOLN
750.0000 mg | INTRAVENOUS | Status: AC
  Filled 2018-11-23: qty 750

## 2018-11-23 MED ORDER — TRANEXAMIC ACID (OHS) BOLUS VIA INFUSION
15.0000 mg/kg | INTRAVENOUS | Status: AC
  Filled 2018-11-23: qty 1677

## 2018-11-24 ENCOUNTER — Ambulatory Visit (HOSPITAL_COMMUNITY): Payer: 59

## 2018-11-24 ENCOUNTER — Ambulatory Visit: Payer: Self-pay | Admitting: Cardiology

## 2018-11-25 MED ORDER — TRANEXAMIC ACID 1000 MG/10ML IV SOLN
1.5000 mg/kg/h | INTRAVENOUS | Status: AC
  Administered 2018-11-26: 1.5 mg/kg/h via INTRAVENOUS
  Administered 2018-11-26: 13:00:00 via INTRAVENOUS
  Filled 2018-11-25: qty 25

## 2018-11-25 MED ORDER — DOPAMINE-DEXTROSE 3.2-5 MG/ML-% IV SOLN
0.0000 ug/kg/min | INTRAVENOUS | Status: DC
  Filled 2018-11-25: qty 250

## 2018-11-25 MED ORDER — TRANEXAMIC ACID (OHS) PUMP PRIME SOLUTION
2.0000 mg/kg | INTRAVENOUS | Status: DC
  Filled 2018-11-25: qty 2.24

## 2018-11-25 MED ORDER — VANCOMYCIN HCL 10 G IV SOLR
1250.0000 mg | INTRAVENOUS | Status: AC
  Administered 2018-11-26: 1250 mg via INTRAVENOUS
  Filled 2018-11-25: qty 1250

## 2018-11-25 MED ORDER — POTASSIUM CHLORIDE 2 MEQ/ML IV SOLN
80.0000 meq | INTRAVENOUS | Status: DC
  Filled 2018-11-25: qty 40

## 2018-11-25 MED ORDER — INSULIN REGULAR(HUMAN) IN NACL 100-0.9 UT/100ML-% IV SOLN
INTRAVENOUS | Status: AC
  Administered 2018-11-26: 1.4 [IU]/h via INTRAVENOUS
  Filled 2018-11-25: qty 100

## 2018-11-25 MED ORDER — EPINEPHRINE PF 1 MG/ML IJ SOLN
0.0000 ug/min | INTRAVENOUS | Status: DC
  Filled 2018-11-25: qty 4

## 2018-11-25 MED ORDER — TRANEXAMIC ACID (OHS) BOLUS VIA INFUSION
15.0000 mg/kg | INTRAVENOUS | Status: AC
  Administered 2018-11-26: 1677 mg via INTRAVENOUS
  Filled 2018-11-25: qty 1677

## 2018-11-25 MED ORDER — NITROGLYCERIN IN D5W 200-5 MCG/ML-% IV SOLN
2.0000 ug/min | INTRAVENOUS | Status: DC
  Filled 2018-11-25 (×2): qty 250

## 2018-11-25 MED ORDER — METOPROLOL TARTRATE 12.5 MG HALF TABLET
12.5000 mg | ORAL_TABLET | Freq: Once | ORAL | Status: AC
  Administered 2018-11-26: 12.5 mg via ORAL
  Filled 2018-11-25: qty 1

## 2018-11-25 MED ORDER — SODIUM CHLORIDE 0.9 % IV SOLN
1.5000 g | INTRAVENOUS | Status: AC
  Administered 2018-11-26: 1.5 g via INTRAVENOUS
  Filled 2018-11-25: qty 1.5

## 2018-11-25 MED ORDER — CHLORHEXIDINE GLUCONATE 0.12 % MT SOLN
15.0000 mL | Freq: Once | OROMUCOSAL | Status: AC
  Administered 2018-11-26: 15 mL via OROMUCOSAL
  Filled 2018-11-25: qty 15

## 2018-11-25 MED ORDER — DEXMEDETOMIDINE HCL IN NACL 400 MCG/100ML IV SOLN
0.1000 ug/kg/h | INTRAVENOUS | Status: AC
  Administered 2018-11-26: 13:00:00 via INTRAVENOUS
  Administered 2018-11-26: .5 ug/kg/h via INTRAVENOUS
  Filled 2018-11-25: qty 100

## 2018-11-25 MED ORDER — PLASMA-LYTE 148 IV SOLN
INTRAVENOUS | Status: AC
  Administered 2018-11-26: 500 mL
  Filled 2018-11-25: qty 2.5

## 2018-11-25 MED ORDER — MILRINONE LACTATE IN DEXTROSE 20-5 MG/100ML-% IV SOLN
0.3000 ug/kg/min | INTRAVENOUS | Status: DC
  Filled 2018-11-25: qty 100

## 2018-11-25 MED ORDER — PHENYLEPHRINE HCL-NACL 20-0.9 MG/250ML-% IV SOLN
30.0000 ug/min | INTRAVENOUS | Status: AC
  Administered 2018-11-26: 20 ug/min via INTRAVENOUS
  Filled 2018-11-25: qty 250

## 2018-11-25 MED ORDER — SODIUM CHLORIDE 0.9 % IV SOLN
750.0000 mg | INTRAVENOUS | Status: DC
  Filled 2018-11-25: qty 750

## 2018-11-25 MED ORDER — SODIUM CHLORIDE 0.9 % IV SOLN
INTRAVENOUS | Status: DC
  Filled 2018-11-25: qty 30

## 2018-11-25 MED ORDER — MAGNESIUM SULFATE 50 % IJ SOLN
40.0000 meq | INTRAMUSCULAR | Status: DC
  Filled 2018-11-25: qty 9.85

## 2018-11-25 NOTE — Anesthesia Preprocedure Evaluation (Addendum)
Anesthesia Evaluation  Patient identified by MRN, date of birth, ID band Patient awake    Reviewed: Allergy & Precautions, NPO status , Patient's Chart, lab work & pertinent test results  Airway Mallampati: II  TM Distance: >3 FB     Dental   Pulmonary shortness of breath, Current Smoker,    breath sounds clear to auscultation       Cardiovascular hypertension, + CAD and + Past MI   Rhythm:Regular Rate:Normal     Neuro/Psych    GI/Hepatic negative GI ROS, Neg liver ROS,   Endo/Other    Renal/GU negative Renal ROS     Musculoskeletal   Abdominal   Peds  Hematology   Anesthesia Other Findings   Reproductive/Obstetrics                            Anesthesia Physical Anesthesia Plan  ASA: III  Anesthesia Plan: General   Post-op Pain Management:    Induction: Intravenous  PONV Risk Score and Plan: Ondansetron, Dexamethasone and Midazolam  Airway Management Planned: Oral ETT  Additional Equipment: Ultrasound Guidance Line Placement, Arterial line, PA Cath and TEE  Intra-op Plan:   Post-operative Plan: Post-operative intubation/ventilation  Informed Consent: I have reviewed the patients History and Physical, chart, labs and discussed the procedure including the risks, benefits and alternatives for the proposed anesthesia with the patient or authorized representative who has indicated his/her understanding and acceptance.     Dental advisory given  Plan Discussed with: Anesthesiologist and CRNA  Anesthesia Plan Comments:        Anesthesia Quick Evaluation

## 2018-11-25 NOTE — H&P (Signed)
[address]       [place] [address]             [phone]      Cardiothoracic Surgery Admission History and Physical   Reason for Admission: Severe multi-vessel coronary artery disease Referring Physician: Dr. Mali Hilty  Gabriel Green is an 58 y.o. male.  HPI:   The patient is a 58 year old gentleman with history of hypertension, borderline diabetes, and obesity, 1 pack/day smoking, and a family history of heart disease who reports about 77-monthhistory of intermittent chest discomfort associated with shortness of breath typically occurring with exertion like walking up steep steps on a crane with a 20 pound robot.  He was also having episodes of substernal chest discomfort radiating into his forearms at night when laying down in bed usually relieved with sitting up.  [place] and [place] and presented to an emergency department there on 11/09/2017.  He was found to have an elevated HS troponin.  It eventually peaked at 1797.  An echocardiogram showed normal ejection fraction.  Cardiac catheterization on 115 reportedly showed severe multivessel disease.  We do not have the cardiac catheterization films yet but have a copy of the cath report.  There was apparently dampening of the waveform with engagement of the left main.  CABG was recommended and he was seen by CT surgery there but desired to come home and opted to leave AMA.  He flew home and presented to the emergency department here on 11/13/2018.  His initial troponin was 0.52 and has decreased to 0.31.  He remained chest pain free for the past week. He [place] was pain [place] and stable he was sent home by cardiology with plans for surgery the following week. [place] and therefore his surgery was postponed a couple times. We just received his cath films  on 11/25/2017 and after review it was apparent that he required CABG and that PCI was not an option.  The patient works in [employer]  He is usually traveling for 8 to 9 weeks at a time.  He does not exercise.  He smokes about 1 pack of cigarettes per day.      Past Medical History:  Diagnosis Date  . Alcohol use   . Borderline diabetes    a. 10/2018 HbA1c = 6.3.  . CAD (coronary artery disease)    a. 11/10/2018 NSTEMI/Cath ([place]: LM 70ost (dampening), LAD 70-80p, D1 20-30, LCX 70-80p, RCA 947m/ L->R collats, RPDA/RPL min irregs.  . History of echocardiogram    a. 11/10/2017 Echo: Nl LV fxn. Triv MR/TR.  . Marland Kitchenypertension   . Morbid obesity ([place]  . Tobacco abuse          Past Surgical History:  Procedure Laterality Date  . TONSILLECTOMY           Family History  Problem Relation Age of Onset  . Alzheimer's disease Mother   . High blood pressure Father   . Thyroid disease Sister   . Other Brother        a & w  . Other Brother        a & w    Social History:  reports that he has been smoking cigarettes. He has  a 40.00 pack-year smoking history. He uses smokeless tobacco. He reports current alcohol use. He reports that he does not use drugs.  Allergies: No Known Allergies  Medications:  I have reviewed the patient's current medications. Prior to Admission:         Medications Prior to Admission  Medication Sig Dispense Refill Last Dose  . amLODipine (NORVASC) 10 MG tablet Take 1 tablet (10 mg total) by mouth daily. (Patient not taking: Reported on 11/13/2018) 30 tablet 1 Not Taking at Unknown time  . lansoprazole (PREVACID) 30 MG capsule Take 1 capsule (30 mg total) by mouth daily at 12 noon. (Patient not taking: Reported on 11/13/2018) 30 capsule 1 Not Taking at Unknown time   Scheduled: . aspirin EC  81 mg Oral Daily  . atorvastatin  80 mg Oral q1800  . carvedilol  3.125 mg Oral  BID WC  . enoxaparin (LOVENOX) injection  110 mg Subcutaneous Q12H  . nicotine  21 mg Transdermal Daily  . nitroGLYCERIN  1 inch Topical Q6H  . pantoprazole  40 mg Oral Q0600   Continuous:  GGY:IRSWNIOEVOJJK, nitroGLYCERIN, ondansetron (ZOFRAN) IV    Anti-infectives (From admission, onward)   None      LabResultsLast48Hours  Results for orders placed or performed during the hospital encounter of 11/13/18 (from the past 48 hour(s))  Basic metabolic panel     Status: Abnormal   Collection Time: 11/13/18  6:33 PM  Result Value Ref Range   Sodium 139 135 - 145 mmol/L   Potassium 3.6 3.5 - 5.1 mmol/L   Chloride 107 98 - 111 mmol/L   CO2 22 22 - 32 mmol/L   Glucose, Bld 129 (H) 70 - 99 mg/dL   BUN 7 6 - 20 mg/dL   Creatinine, Ser 1.17 0.61 - 1.24 mg/dL   Calcium 9.0 8.9 - 10.3 mg/dL   GFR calc non Af Amer >60 >60 mL/min   GFR calc Af Amer >60 >60 mL/min   Anion gap 10 5 - 15    Comment: Performed at [facility], [place] [address] [place], [place] [address]  CBC     Status: Abnormal   Collection Time: 11/13/18  6:33 PM  Result Value Ref Range   WBC 11.8 (H) 4.0 - 10.5 K/uL   RBC 4.56 4.22 - 5.81 MIL/uL   Hemoglobin 14.5 13.0 - 17.0 g/dL   HCT 45.4 39.0 - 52.0 %   MCV 99.6 80.0 - 100.0 fL   MCH 31.8 26.0 - 34.0 pg   MCHC 31.9 30.0 - 36.0 g/dL   RDW 12.7 11.5 - 15.5 %   Platelets 269 150 - 400 K/uL   nRBC 0.0 0.0 - 0.2 %    Comment: Performed at [facility], [place] [address], [place], [place] [address]  I-stat troponin, ED     Status: Abnormal   Collection Time: 11/13/18  7:02 PM  Result Value Ref Range   Troponin i, poc 0.50 (HH) 0.00 - 0.08 ng/mL   Comment 3            Comment: Due to the release kinetics of cTnI, a negative result within the first hours of the onset of symptoms does not rule out myocardial infarction with certainty. If myocardial infarction is still suspected, repeat the test at appropriate  intervals.   Troponin I - Now Then Q6H     Status: Abnormal   Collection Time: 11/13/18 10:11 PM  Result Value Ref Range   Troponin  I 0.52 (HH) <0.03 ng/mL    Comment: CRITICAL RESULT CALLED TO, READ BACK BY AND VERIFIED WITH: HELEM M,RN 11/13/18 2322 WAYK Performed at [facility], [place] [address], [place], [place] [address]   Troponin I - Now Then Q6H     Status: Abnormal   Collection Time: 11/14/18  4:16 AM  Result Value Ref Range   Troponin I 0.45 (HH) <0.03 ng/mL    Comment: CRITICAL VALUE NOTED.  VALUE IS CONSISTENT WITH PREVIOUSLY REPORTED AND CALLED VALUE. Performed at [facility], [place] [address], [place], [place] [address]   Basic metabolic panel     Status: Abnormal   Collection Time: 11/14/18  4:16 AM  Result Value Ref Range   Sodium 138 135 - 145 mmol/L   Potassium 3.7 3.5 - 5.1 mmol/L   Chloride 108 98 - 111 mmol/L   CO2 21 (L) 22 - 32 mmol/L   Glucose, Bld 133 (H) 70 - 99 mg/dL   BUN 8 6 - 20 mg/dL   Creatinine, Ser 1.02 0.61 - 1.24 mg/dL   Calcium 8.8 (L) 8.9 - 10.3 mg/dL   GFR calc non Af Amer >60 >60 mL/min   GFR calc Af Amer >60 >60 mL/min   Anion gap 9 5 - 15    Comment: Performed at [place] [address], [place], [place] [address]  CBC WITH DIFFERENTIAL     Status: Abnormal   Collection Time: 11/14/18  4:16 AM  Result Value Ref Range   WBC 11.1 (H) 4.0 - 10.5 K/uL   RBC 4.27 4.22 - 5.81 MIL/uL   Hemoglobin 14.2 13.0 - 17.0 g/dL   HCT 42.5 39.0 - 52.0 %   MCV 99.5 80.0 - 100.0 fL   MCH 33.3 26.0 - 34.0 pg   MCHC 33.4 30.0 - 36.0 g/dL   RDW 13.0 11.5 - 15.5 %   Platelets 270 150 - 400 K/uL   nRBC 0.0 0.0 - 0.2 %   Neutrophils Relative % 51 %   Neutro Abs 5.7 1.7 - 7.7 K/uL   Lymphocytes Relative 38 %   Lymphs Abs 4.3 (H) 0.7 - 4.0 K/uL   Monocytes Relative 6 %   Monocytes Absolute 0.7 0.1 - 1.0 K/uL   Eosinophils Relative 4 %   Eosinophils Absolute 0.4 0.0 - 0.5 K/uL    Basophils Relative 1 %   Basophils Absolute 0.1 0.0 - 0.1 K/uL   Immature Granulocytes 0 %   Abs Immature Granulocytes 0.05 0.00 - 0.07 K/uL    Comment: Performed at [facility], [address] [place], [place] [address]  Troponin I - Now Then Q6H     Status: Abnormal   Collection Time: 11/14/18 10:47 AM  Result Value Ref Range   Troponin I 0.31 (HH) <0.03 ng/mL    Comment: CRITICAL VALUE NOTED.  VALUE IS CONSISTENT WITH PREVIOUSLY REPORTED AND CALLED VALUE. Performed at [facility], [place] [address] [place], [place] [address]        ImagingResults(Last48hours)  Dg Chest 2 View  Result Date: 11/13/2018 CLINICAL DATA:  Cardiac episode today EXAM: CHEST - 2 VIEW COMPARISON:  Mar 11, 2014 FINDINGS: The heart size and mediastinal contours are within normal limits. Both lungs are clear. The visualized skeletal structures are unremarkable. IMPRESSION: No active cardiopulmonary disease. Electronically Signed   By: Abelardo Diesel M.D.   On: 11/13/2018 19:44     Review of Systems  Constitutional: Positive  for malaise/fatigue.  Eyes: Negative.   Respiratory: Negative for shortness of breath.   Cardiovascular: Positive for chest pain. Negative for palpitations, orthopnea, leg swelling and PND.  Gastrointestinal: Negative.   Genitourinary: Negative.   Musculoskeletal: Negative.   Skin: Negative.   Neurological: Negative.   Endo/Heme/Allergies: Negative.   Psychiatric/Behavioral: Negative.    Blood pressure 123/72, pulse 77, temperature 97.7 F (36.5 C), temperature source Oral, resp. rate 18, height '5\' 9"'  (1.753 m), weight 113 kg, SpO2 96 %. Physical Exam  Constitutional: He is oriented to person, place, and time.  Obese gentleman in no distress  HENT:  Head: Normocephalic and atraumatic.  Mouth/Throat: Oropharynx is clear and moist.  Eyes: Pupils are equal, round, and reactive to light. EOM are normal.  Neck: Normal range of motion. Neck supple. No  JVD present. No thyromegaly present.  Cardiovascular: Normal rate, regular rhythm, normal heart sounds and intact distal pulses.  No murmur heard. Respiratory: Effort normal and breath sounds normal. No respiratory distress.  GI: Soft. Bowel sounds are normal. He exhibits no distension. There is no abdominal tenderness.  Musculoskeletal: Normal range of motion.        General: No edema.  Lymphadenopathy:    He has no cervical adenopathy.  Neurological: He is alert and oriented to person, place, and time. He has normal strength. No cranial nerve deficit or sensory deficit.  Skin: Skin is warm and dry.  Psychiatric: He has a normal mood and affect.     CARDIAC CATHETERIZATION REPORT  INDICATION: Non-ST elevation myocardial infarction  Interventional Cardiology Attending: Dr. Carlynn Spry  Fellow: Dr Harlow Mares  Date of Procedure: November 11, 2018  PROCEDURE:   1. IV concious sedation and continuous hemodynamic monitoring 2. Access to right femoral artery via modified Seldinger technique 3. Left heart catheterization with crossing of the aortic valve into the  left ventricle with pressures obtained 4. Bilateral selective coronary angiography 5. Selective right iliofemoral angiography for device closure possibility 6. Patent hemostasis achieved in the right femoral artery using a 6  French Angio-Seal device     Equipment - Please refer to merge system   Anesthesia: - IV concious sedation, Local analgesia with 1% lidocaine infused into the  subcutaneous tissue of the R groin   Moderate sedation services provided by Dr Carlynn Spry and other qualified  health care professionals performing the diagnostic or therapeutic service  that the sedation supports, requiring the presence of an independent  trained observer to assist in the monitoring of the patient's level of  consciousness and physiological status.   Sedation start time 1346 - End time 1406  Contrast: 90  cc   HEMODYNAMICS:  AO: 101/57/78 mmHg LVEDP: 20 mmHg LV: 100/4/19 mmHg    CORONARY ANGIOGRAPHY: Dominance: Right Left Main: The left main is a large caliber vessel that bifurcates into  the LAD and left circumflex arteries, there is a 70% stenotic lesion at  the ostium with significant drop in blood pressure when the vessel was  engaged with the catheter, TIMI-3 flow LAD: The LAD is a large caliber vessel that wraps around to the apex,  there is a 70 to 80% stenotic lesion in the proximal segment, the  remainder of the vessel has mild disease, TIMI-3 flow. There are left to  right collaterals present. Diagonals: The first diagonal is a large caliber vessel that has 20 to 30%  stenosis LCx: Left circumflex is a large caliber vessel that has 70 to 80% stenosis  in the proximal segment,  the remainder of the vessel has mild disease,  TIMI-3 flow Obtuse marginals: The first OM is a large caliber vessel with no  significant disease, the second and third OM are small-caliber vessels  with mild disease RCA: The RCA is a large caliber vessel, there is mild disease in the  proximal segment, there is diffuse disease in the midsegment with a 90%  stenotic lesion, the distal vessel has mild disease, TIMI-3 flow. There  are left to right collaterals present PDA/PLA: These are moderate caliber vessels with mild luminal  irregularities     Procedure: Patient was bought to the cardiac catheterization lab after  obtaining informed consent. B/L groins were prepped and draped in sterile  manner. Arterial Access was obtained via R common femoral A. Using  modified Seldinger technique using micropuncture kit and ultrasound  guidance. A 6 French sheath was inserted into the right femoral artery.  A JL4 catheter was inserted through the sheath and advanced into the a  sending aorta where was used to engage the left main coronary artery.  Selective left coronary angiography was  performed in multiple views.  Next, the JL4 catheter was exchanged for a JR4 catheter which was used to  engage the right coronary artery selectively. Right coronary angiography  was performed in multiple views. Next, a pigtail catheter was inserted  and used to cross the aortic valve into the left ventricle where pressures  were recorded. We then performed a limited iliofemoral angiography on the  right to determine if device closure was a possibility. A 6 French  Angio-Seal device was deployed in the right femoral artery to achieve  patent hemostasis. The patient tolerated the procedure well without any  complications. He left the Cath Lab in stable condition return to post  cardiac cath recovery for close monitoring.     Intervention: None  Hemostasis: Once patient was deemend an appropriate candidate for closure device  placement a 6Fr Angioseal was successfully inserted into R Common Femoral  A. With excellent hemostasis achieved.    Complications:  None. At the end of the procedure the patient was transferred out of the  Cardiac catheterization laboratory in hemodynamically and clinically  stable condition.  CONCLUSION: 1. Significant multivessel coronary artery disease as described above  including an 80% stenotic lesion in the proximal LAD, 80% stenotic lesion  in the proximal left circumflex, and 90% stenotic lesion in the mid RCA 2. Hypertension 3. Nicotine abuse  RECOMMENDATION: 1. We discussed with the patient the need for CABG. We will consult  cardiothoracic surgery, Dr. Vira Blanco, to evaluate the patient 2. Continue the heparin 3. Obtain carotid Dopplers 4. High intensity statin 5. Hold antiplatelets until we know plan for possible surgery 6. Monitor closely and post cath recovery and transfer back to the floor  Discussed with Attending, Dr. Jon Gills [facility] Interventional cardiology  fellow 11/11/2018   Assessment/Plan:  This 58 year old gentleman has by report 70% ostial left main and severe three-vessel coronary disease with normal left ventricular systolic function.  He presented in [place] with a non-ST segment elevation MI Sunday a week ago and has had no chest pain since.  Based on the cath films I agree that coronary artery bypass graft surgery is indicated in this patient.  I discussed the operative procedure with the patient including alternatives, benefits and risks; including but not limited to bleeding, blood transfusion, infection, stroke, myocardial infarction, graft failure, heart block requiring a permanent pacemaker, organ dysfunction, and death.  Toby  Kenyon understands and agrees to proceed.     Gaye Pollack

## 2018-11-26 ENCOUNTER — Inpatient Hospital Stay (HOSPITAL_COMMUNITY): Payer: 59

## 2018-11-26 ENCOUNTER — Inpatient Hospital Stay (HOSPITAL_COMMUNITY)
Admission: RE | Disposition: A | Discharge status: Home / Self Care | Payer: Self-pay | Source: Home / Self Care | Attending: Surgery

## 2018-11-26 ENCOUNTER — Other Ambulatory Visit: Payer: Self-pay

## 2018-11-26 ENCOUNTER — Inpatient Hospital Stay (HOSPITAL_COMMUNITY)
Admission: RE | Admit: 2018-11-26 | Discharge: 2018-11-30 | DRG: 236 | Disposition: A | Discharge status: Home / Self Care | Payer: 59 | Attending: Surgery | Admitting: Surgery

## 2018-11-26 ENCOUNTER — Inpatient Hospital Stay (HOSPITAL_COMMUNITY): Payer: 59 | Admitting: Certified Registered Nurse Anesthetist

## 2018-11-26 ENCOUNTER — Inpatient Hospital Stay (HOSPITAL_COMMUNITY): Payer: 59 | Admitting: Physician Assistant

## 2018-11-26 ENCOUNTER — Encounter (HOSPITAL_COMMUNITY): Payer: Self-pay

## 2018-11-26 DIAGNOSIS — Z820 Family history of epilepsy and other diseases of the nervous system: Secondary | ICD-10-CM

## 2018-11-26 DIAGNOSIS — D62 Acute posthemorrhagic anemia: Secondary | ICD-10-CM | POA: Diagnosis not present

## 2018-11-26 DIAGNOSIS — I2510 Atherosclerotic heart disease of native coronary artery without angina pectoris: Secondary | ICD-10-CM | POA: Diagnosis present

## 2018-11-26 DIAGNOSIS — E669 Obesity, unspecified: Secondary | ICD-10-CM | POA: Diagnosis present

## 2018-11-26 DIAGNOSIS — Z716 Tobacco abuse counseling: Secondary | ICD-10-CM

## 2018-11-26 DIAGNOSIS — F17210 Nicotine dependence, cigarettes, uncomplicated: Secondary | ICD-10-CM | POA: Diagnosis present

## 2018-11-26 DIAGNOSIS — Z951 Presence of aortocoronary bypass graft: Secondary | ICD-10-CM | POA: Diagnosis not present

## 2018-11-26 DIAGNOSIS — E875 Hyperkalemia: Secondary | ICD-10-CM | POA: Diagnosis present

## 2018-11-26 DIAGNOSIS — D72829 Elevated white blood cell count, unspecified: Secondary | ICD-10-CM | POA: Diagnosis present

## 2018-11-26 DIAGNOSIS — I214 Non-ST elevation (NSTEMI) myocardial infarction: Principal | ICD-10-CM | POA: Diagnosis present

## 2018-11-26 DIAGNOSIS — E785 Hyperlipidemia, unspecified: Secondary | ICD-10-CM | POA: Diagnosis present

## 2018-11-26 DIAGNOSIS — Z8249 Family history of ischemic heart disease and other diseases of the circulatory system: Secondary | ICD-10-CM | POA: Diagnosis not present

## 2018-11-26 DIAGNOSIS — E119 Type 2 diabetes mellitus without complications: Secondary | ICD-10-CM | POA: Diagnosis present

## 2018-11-26 DIAGNOSIS — Z6836 Body mass index (BMI) 36.0-36.9, adult: Secondary | ICD-10-CM | POA: Diagnosis not present

## 2018-11-26 DIAGNOSIS — J9811 Atelectasis: Secondary | ICD-10-CM | POA: Diagnosis not present

## 2018-11-26 DIAGNOSIS — Z8349 Family history of other endocrine, nutritional and metabolic diseases: Secondary | ICD-10-CM

## 2018-11-26 DIAGNOSIS — I10 Essential (primary) hypertension: Secondary | ICD-10-CM | POA: Diagnosis present

## 2018-11-26 HISTORY — PX: CORONARY ARTERY BYPASS GRAFT: SHX141

## 2018-11-26 HISTORY — PX: ENDOVEIN HARVEST OF GREATER SAPHENOUS VEIN: SHX5059

## 2018-11-26 HISTORY — PX: TEE WITHOUT CARDIOVERSION: SHX5443

## 2018-11-26 LAB — POCT I-STAT 7, (LYTES, BLD GAS, ICA,H+H)
Acid-Base Excess: 1 mmol/L (ref 0.0–2.0)
Acid-base deficit: 1 mmol/L (ref 0.0–2.0)
Acid-base deficit: 3 mmol/L — ABNORMAL HIGH (ref 0.0–2.0)
Acid-base deficit: 2 mmol/L (ref 0.0–2.0)
Acid-base deficit: 3 mmol/L — ABNORMAL HIGH (ref 0.0–2.0)
BICARBONATE: 23.2 mmol/L (ref 20.0–28.0)
Bicarbonate: 26.7 mmol/L (ref 20.0–28.0)
Bicarbonate: 25 mmol/L (ref 20.0–28.0)
Bicarbonate: 23.5 mmol/L (ref 20.0–28.0)
Bicarbonate: 21.4 mmol/L (ref 20.0–28.0)
Calcium, Ion: 1.01 mmol/L — ABNORMAL LOW (ref 1.15–1.40)
Calcium, Ion: 1.1 mmol/L — ABNORMAL LOW (ref 1.15–1.40)
Calcium, Ion: 1.15 mmol/L (ref 1.15–1.40)
Calcium, Ion: 1.13 mmol/L — ABNORMAL LOW (ref 1.15–1.40)
Calcium, Ion: 1.13 mmol/L — ABNORMAL LOW (ref 1.15–1.40)
HCT: 33 % — ABNORMAL LOW (ref 39.0–52.0)
HCT: 35 % — ABNORMAL LOW (ref 39.0–52.0)
HCT: 35 % — ABNORMAL LOW (ref 39.0–52.0)
HCT: 35 % — ABNORMAL LOW (ref 39.0–52.0)
HCT: 33 % — ABNORMAL LOW (ref 39.0–52.0)
HEMOGLOBIN: 11.2 g/dL — AB (ref 13.0–17.0)
Hemoglobin: 11.2 g/dL — ABNORMAL LOW (ref 13.0–17.0)
Hemoglobin: 11.9 g/dL — ABNORMAL LOW (ref 13.0–17.0)
Hemoglobin: 11.9 g/dL — ABNORMAL LOW (ref 13.0–17.0)
Hemoglobin: 11.9 g/dL — ABNORMAL LOW (ref 13.0–17.0)
O2 Saturation: 100 %
O2 Saturation: 100 %
O2 Saturation: 95 %
O2 Saturation: 98 %
O2 Saturation: 92 %
PCO2 ART: 44.1 mmHg (ref 32.0–48.0)
PO2 ART: 76 mmHg — AB (ref 83.0–108.0)
POTASSIUM: 4.2 mmol/L (ref 3.5–5.1)
Patient temperature: 35.3
Patient temperature: 37.4
Patient temperature: 37.8
Potassium: 4.5 mmol/L (ref 3.5–5.1)
Potassium: 5.1 mmol/L (ref 3.5–5.1)
Potassium: 4.6 mmol/L (ref 3.5–5.1)
Potassium: 4.4 mmol/L (ref 3.5–5.1)
Sodium: 139 mmol/L (ref 135–145)
Sodium: 137 mmol/L (ref 135–145)
Sodium: 140 mmol/L (ref 135–145)
Sodium: 140 mmol/L (ref 135–145)
Sodium: 140 mmol/L (ref 135–145)
TCO2: 28 mmol/L (ref 22–32)
TCO2: 26 mmol/L (ref 22–32)
TCO2: 25 mmol/L (ref 22–32)
TCO2: 24 mmol/L (ref 22–32)
TCO2: 23 mmol/L (ref 22–32)
pCO2 arterial: 46.6 mmHg (ref 32.0–48.0)
pCO2 arterial: 45.5 mmHg (ref 32.0–48.0)
pCO2 arterial: 40.7 mmHg (ref 32.0–48.0)
pCO2 arterial: 37.7 mmHg (ref 32.0–48.0)
pH, Arterial: 7.365 (ref 7.350–7.450)
pH, Arterial: 7.348 — ABNORMAL LOW (ref 7.350–7.450)
pH, Arterial: 7.326 — ABNORMAL LOW (ref 7.350–7.450)
pH, Arterial: 7.366 (ref 7.350–7.450)
pH, Arterial: 7.366 (ref 7.350–7.450)
pO2, Arterial: 433 mmHg — ABNORMAL HIGH (ref 83.0–108.0)
pO2, Arterial: 208 mmHg — ABNORMAL HIGH (ref 83.0–108.0)
pO2, Arterial: 115 mmHg — ABNORMAL HIGH (ref 83.0–108.0)
pO2, Arterial: 68 mmHg — ABNORMAL LOW (ref 83.0–108.0)

## 2018-11-26 LAB — POCT I-STAT 4, (NA,K, GLUC, HGB,HCT)
Glucose, Bld: 128 mg/dL — ABNORMAL HIGH (ref 70–99)
Glucose, Bld: 122 mg/dL — ABNORMAL HIGH (ref 70–99)
Glucose, Bld: 107 mg/dL — ABNORMAL HIGH (ref 70–99)
Glucose, Bld: 127 mg/dL — ABNORMAL HIGH (ref 70–99)
Glucose, Bld: 139 mg/dL — ABNORMAL HIGH (ref 70–99)
HCT: 43 % (ref 39.0–52.0)
HCT: 38 % — ABNORMAL LOW (ref 39.0–52.0)
HCT: 33 % — ABNORMAL LOW (ref 39.0–52.0)
HCT: 33 % — ABNORMAL LOW (ref 39.0–52.0)
HCT: 40 % (ref 39.0–52.0)
HEMOGLOBIN: 11.2 g/dL — AB (ref 13.0–17.0)
HEMOGLOBIN: 11.2 g/dL — AB (ref 13.0–17.0)
Hemoglobin: 14.6 g/dL (ref 13.0–17.0)
Hemoglobin: 12.9 g/dL — ABNORMAL LOW (ref 13.0–17.0)
Hemoglobin: 13.6 g/dL (ref 13.0–17.0)
Potassium: 4.3 mmol/L (ref 3.5–5.1)
Potassium: 4.2 mmol/L (ref 3.5–5.1)
Potassium: 4.5 mmol/L (ref 3.5–5.1)
Potassium: 5.1 mmol/L (ref 3.5–5.1)
Potassium: 5.6 mmol/L — ABNORMAL HIGH (ref 3.5–5.1)
SODIUM: 139 mmol/L (ref 135–145)
Sodium: 137 mmol/L (ref 135–145)
Sodium: 138 mmol/L (ref 135–145)
Sodium: 136 mmol/L (ref 135–145)
Sodium: 137 mmol/L (ref 135–145)

## 2018-11-26 LAB — BASIC METABOLIC PANEL
Anion gap: 6 (ref 5–15)
BUN: 9 mg/dL (ref 6–20)
CO2: 21 mmol/L — ABNORMAL LOW (ref 22–32)
Calcium: 8.2 mg/dL — ABNORMAL LOW (ref 8.9–10.3)
Chloride: 109 mmol/L (ref 98–111)
Creatinine, Ser: 0.97 mg/dL (ref 0.61–1.24)
GFR calc Af Amer: >60 mL/min (ref >60)
Glucose, Bld: 124 mg/dL — ABNORMAL HIGH (ref 70–99)
Potassium: 4.4 mmol/L (ref 3.5–5.1)
Sodium: 136 mmol/L (ref 135–145)

## 2018-11-26 LAB — CBC
HCT: 36.8 % — ABNORMAL LOW (ref 39.0–52.0)
HCT: 37.4 % — ABNORMAL LOW (ref 39.0–52.0)
Hemoglobin: 12 g/dL — ABNORMAL LOW (ref 13.0–17.0)
Hemoglobin: 12.4 g/dL — ABNORMAL LOW (ref 13.0–17.0)
MCH: 32.5 pg (ref 26.0–34.0)
MCH: 32.9 pg (ref 26.0–34.0)
MCHC: 32.6 g/dL (ref 30.0–36.0)
MCHC: 33.2 g/dL (ref 30.0–36.0)
MCV: 99.7 fL (ref 80.0–100.0)
MCV: 99.2 fL (ref 80.0–100.0)
NRBC: 0 % (ref 0.0–0.2)
Platelets: 241 10*3/uL (ref 150–400)
Platelets: 279 10*3/uL (ref 150–400)
RBC: 3.69 MIL/uL — ABNORMAL LOW (ref 4.22–5.81)
RBC: 3.77 MIL/uL — AB (ref 4.22–5.81)
RDW: 12.4 % (ref 11.5–15.5)
RDW: 12.6 % (ref 11.5–15.5)
WBC: 20.2 10*3/uL — ABNORMAL HIGH (ref 4.0–10.5)
WBC: 18.1 10*3/uL — ABNORMAL HIGH (ref 4.0–10.5)
nRBC: 0 % (ref 0.0–0.2)

## 2018-11-26 LAB — GLUCOSE, CAPILLARY
GLUCOSE-CAPILLARY: 113 mg/dL — AB (ref 70–99)
GLUCOSE-CAPILLARY: 115 mg/dL — AB (ref 70–99)
Glucose-Capillary: 149 mg/dL — ABNORMAL HIGH (ref 70–99)
Glucose-Capillary: 115 mg/dL — ABNORMAL HIGH (ref 70–99)
Glucose-Capillary: 110 mg/dL — ABNORMAL HIGH (ref 70–99)
Glucose-Capillary: 119 mg/dL — ABNORMAL HIGH (ref 70–99)
Glucose-Capillary: 152 mg/dL — ABNORMAL HIGH (ref 70–99)
Glucose-Capillary: 134 mg/dL — ABNORMAL HIGH (ref 70–99)
Glucose-Capillary: 120 mg/dL — ABNORMAL HIGH (ref 70–99)

## 2018-11-26 LAB — PROTIME-INR
INR: 1.43
Prothrombin Time: 17.3 seconds — ABNORMAL HIGH (ref 11.4–15.2)

## 2018-11-26 LAB — MAGNESIUM: MAGNESIUM: 2.9 mg/dL — AB (ref 1.7–2.4)

## 2018-11-26 LAB — PLATELET COUNT: PLATELETS: 234 10*3/uL (ref 150–400)

## 2018-11-26 LAB — HEMOGLOBIN AND HEMATOCRIT, BLOOD
HCT: 36.5 % — ABNORMAL LOW (ref 39.0–52.0)
Hemoglobin: 11.8 g/dL — ABNORMAL LOW (ref 13.0–17.0)

## 2018-11-26 LAB — APTT: aPTT: 38 seconds — ABNORMAL HIGH (ref 24–36)

## 2018-11-26 SURGERY — CORONARY ARTERY BYPASS GRAFTING (CABG)
Anesthesia: General | Site: Leg Upper | Laterality: Right

## 2018-11-26 MED ORDER — 0.9 % SODIUM CHLORIDE (POUR BTL) OPTIME
TOPICAL | Status: DC | PRN
  Administered 2018-11-26: 5000 mL

## 2018-11-26 MED ORDER — LACTATED RINGERS IV SOLN: 500.0000 mL | Freq: Once | INTRAVENOUS | Status: DC | PRN

## 2018-11-26 MED ORDER — BISACODYL 10 MG RE SUPP: 10.0000 mg | Freq: Every day | RECTAL | Status: DC

## 2018-11-26 MED ORDER — CHLORHEXIDINE GLUCONATE 0.12 % MT SOLN
15.0000 mL | OROMUCOSAL | Status: AC
  Administered 2018-11-26: 15 mL via OROMUCOSAL

## 2018-11-26 MED ORDER — LIDOCAINE 2% (20 MG/ML) 5 ML SYRINGE
INTRAMUSCULAR | Status: DC | PRN
  Administered 2018-11-26: 100 mg via INTRAVENOUS

## 2018-11-26 MED ORDER — PROTAMINE SULFATE 10 MG/ML IV SOLN
INTRAVENOUS | Status: DC | PRN
  Administered 2018-11-26: 10 mg via INTRAVENOUS
  Administered 2018-11-26: 390 mg via INTRAVENOUS

## 2018-11-26 MED ORDER — SODIUM CHLORIDE 0.9 % IV SOLN
INTRAVENOUS | Status: DC | PRN
  Administered 2018-11-26: 750 mg via INTRAVENOUS

## 2018-11-26 MED ORDER — PROPOFOL 10 MG/ML IV BOLUS
INTRAVENOUS | Status: AC
  Filled 2018-11-26: qty 20

## 2018-11-26 MED ORDER — LIDOCAINE 2% (20 MG/ML) 5 ML SYRINGE
INTRAMUSCULAR | Status: AC
  Filled 2018-11-26: qty 5

## 2018-11-26 MED ORDER — SODIUM CHLORIDE 0.9% FLUSH: 3.0000 mL | INTRAVENOUS | Status: DC | PRN

## 2018-11-26 MED ORDER — CHLORHEXIDINE GLUCONATE 0.12% ORAL RINSE (MEDLINE KIT): 15.0000 mL | Freq: Two times a day (BID) | OROMUCOSAL | Status: DC

## 2018-11-26 MED ORDER — SODIUM CHLORIDE 0.45 % IV SOLN
INTRAVENOUS | Status: DC | PRN
  Administered 2018-11-26: 14:00:00 via INTRAVENOUS

## 2018-11-26 MED ORDER — DEXMEDETOMIDINE HCL IN NACL 200 MCG/50ML IV SOLN
0.0000 ug/kg/h | INTRAVENOUS | Status: DC
  Administered 2018-11-26: 0.7 ug/kg/h via INTRAVENOUS

## 2018-11-26 MED ORDER — SODIUM CHLORIDE 0.9 % IV SOLN
INTRAVENOUS | Status: DC
  Administered 2018-11-26: 14:00:00 via INTRAVENOUS

## 2018-11-26 MED ORDER — SODIUM CHLORIDE 0.9% FLUSH: 10.0000 mL | INTRAVENOUS | Status: DC | PRN

## 2018-11-26 MED ORDER — FENTANYL CITRATE (PF) 250 MCG/5ML IJ SOLN
INTRAMUSCULAR | Status: DC | PRN
  Administered 2018-11-26: 150 ug via INTRAVENOUS
  Administered 2018-11-26: 100 ug via INTRAVENOUS
  Administered 2018-11-26 (×2): 50 ug via INTRAVENOUS
  Administered 2018-11-26: 100 ug via INTRAVENOUS
  Administered 2018-11-26: 200 ug via INTRAVENOUS
  Administered 2018-11-26 (×2): 150 ug via INTRAVENOUS
  Administered 2018-11-26: 100 ug via INTRAVENOUS
  Administered 2018-11-26: 150 ug via INTRAVENOUS
  Administered 2018-11-26: 50 ug via INTRAVENOUS

## 2018-11-26 MED ORDER — PROPOFOL 10 MG/ML IV BOLUS
INTRAVENOUS | Status: DC | PRN
  Administered 2018-11-26: 130 mg via INTRAVENOUS
  Administered 2018-11-26: 70 mg via INTRAVENOUS

## 2018-11-26 MED ORDER — HEMOSTATIC AGENTS (NO CHARGE) OPTIME
TOPICAL | Status: DC | PRN
  Administered 2018-11-26: 3 via TOPICAL
  Administered 2018-11-26: 1 via TOPICAL

## 2018-11-26 MED ORDER — LACTATED RINGERS IV SOLN
INTRAVENOUS | Status: DC | PRN
  Administered 2018-11-26: 07:00:00 via INTRAVENOUS

## 2018-11-26 MED ORDER — PHENYLEPHRINE HCL-NACL 20-0.9 MG/250ML-% IV SOLN
0.0000 ug/min | INTRAVENOUS | Status: DC
  Filled 2018-11-26: qty 250

## 2018-11-26 MED ORDER — SODIUM CHLORIDE 0.9 % IV SOLN
1.5000 g | Freq: Two times a day (BID) | INTRAVENOUS | Status: AC
  Administered 2018-11-26 – 2018-11-28 (×4): 1.5 g via INTRAVENOUS
  Filled 2018-11-26 (×4): qty 1.5

## 2018-11-26 MED ORDER — HEPARIN SODIUM (PORCINE) 1000 UNIT/ML IJ SOLN
INTRAMUSCULAR | Status: AC
  Filled 2018-11-26: qty 1

## 2018-11-26 MED ORDER — SUCCINYLCHOLINE CHLORIDE 200 MG/10ML IV SOSY
PREFILLED_SYRINGE | INTRAVENOUS | Status: DC | PRN
  Administered 2018-11-26: 120 mg via INTRAVENOUS

## 2018-11-26 MED ORDER — FAMOTIDINE IN NACL 20-0.9 MG/50ML-% IV SOLN
20.0000 mg | Freq: Two times a day (BID) | INTRAVENOUS | Status: AC
  Administered 2018-11-26: 20 mg via INTRAVENOUS

## 2018-11-26 MED ORDER — ACETAMINOPHEN 160 MG/5ML PO SOLN: 1000.0000 mg | Freq: Four times a day (QID) | ORAL | Status: DC

## 2018-11-26 MED ORDER — INSULIN REGULAR BOLUS VIA INFUSION
0.0000 [IU] | Freq: Three times a day (TID) | INTRAVENOUS | Status: DC
  Filled 2018-11-26: qty 10

## 2018-11-26 MED ORDER — METOPROLOL TARTRATE 25 MG/10 ML ORAL SUSPENSION: 12.5000 mg | Freq: Two times a day (BID) | ORAL | Status: DC

## 2018-11-26 MED ORDER — ALBUMIN HUMAN 5 % IV SOLN
250.0000 mL | INTRAVENOUS | Status: AC | PRN
  Administered 2018-11-26: 12.5 g via INTRAVENOUS

## 2018-11-26 MED ORDER — LACTATED RINGERS IV SOLN: INTRAVENOUS | Status: DC

## 2018-11-26 MED ORDER — DEXMEDETOMIDINE HCL IN NACL 200 MCG/50ML IV SOLN
INTRAVENOUS | Status: AC
  Filled 2018-11-26: qty 50

## 2018-11-26 MED ORDER — DOCUSATE SODIUM 100 MG PO CAPS
200.0000 mg | ORAL_CAPSULE | Freq: Every day | ORAL | Status: DC
  Administered 2018-11-27 – 2018-11-30 (×3): 200 mg via ORAL
  Filled 2018-11-26 (×3): qty 2

## 2018-11-26 MED ORDER — INSULIN REGULAR(HUMAN) IN NACL 100-0.9 UT/100ML-% IV SOLN: INTRAVENOUS | Status: DC

## 2018-11-26 MED ORDER — THROMBIN (RECOMBINANT) 20000 UNITS EX SOLR
CUTANEOUS | Status: AC
  Filled 2018-11-26: qty 20000

## 2018-11-26 MED ORDER — ALBUMIN HUMAN 5 % IV SOLN
INTRAVENOUS | Status: DC | PRN
  Administered 2018-11-26 (×3): via INTRAVENOUS

## 2018-11-26 MED ORDER — MAGNESIUM SULFATE 4 GM/100ML IV SOLN
4.0000 g | Freq: Once | INTRAVENOUS | Status: AC
  Administered 2018-11-26: 4 g via INTRAVENOUS
  Filled 2018-11-26: qty 100

## 2018-11-26 MED ORDER — VANCOMYCIN HCL IN DEXTROSE 1-5 GM/200ML-% IV SOLN
1000.0000 mg | Freq: Once | INTRAVENOUS | Status: AC
  Administered 2018-11-26: 1000 mg via INTRAVENOUS
  Filled 2018-11-26: qty 200

## 2018-11-26 MED ORDER — ROCURONIUM BROMIDE 50 MG/5ML IV SOSY
PREFILLED_SYRINGE | INTRAVENOUS | Status: AC
  Filled 2018-11-26: qty 20

## 2018-11-26 MED ORDER — ACETAMINOPHEN 500 MG PO TABS
1000.0000 mg | ORAL_TABLET | Freq: Four times a day (QID) | ORAL | Status: DC
  Administered 2018-11-26 – 2018-11-30 (×10): 1000 mg via ORAL
  Filled 2018-11-26 (×12): qty 2

## 2018-11-26 MED ORDER — THROMBIN 20000 UNITS EX SOLR
CUTANEOUS | Status: DC | PRN
  Administered 2018-11-26: 20000 [IU] via TOPICAL

## 2018-11-26 MED ORDER — PROTAMINE SULFATE 10 MG/ML IV SOLN
INTRAVENOUS | Status: AC
  Filled 2018-11-26: qty 10

## 2018-11-26 MED ORDER — ASPIRIN EC 325 MG PO TBEC
325.0000 mg | DELAYED_RELEASE_TABLET | Freq: Every day | ORAL | Status: DC
  Administered 2018-11-27 – 2018-11-30 (×4): 325 mg via ORAL
  Filled 2018-11-26 (×4): qty 1

## 2018-11-26 MED ORDER — SODIUM CHLORIDE (PF) 0.9 % IJ SOLN
INTRAMUSCULAR | Status: AC
  Filled 2018-11-26: qty 10

## 2018-11-26 MED ORDER — FENTANYL CITRATE (PF) 250 MCG/5ML IJ SOLN
INTRAMUSCULAR | Status: AC
  Filled 2018-11-26: qty 25

## 2018-11-26 MED ORDER — POTASSIUM CHLORIDE 10 MEQ/50ML IV SOLN: 10.0000 meq | INTRAVENOUS | Status: AC

## 2018-11-26 MED ORDER — MIDAZOLAM HCL 5 MG/5ML IJ SOLN
INTRAMUSCULAR | Status: DC | PRN
  Administered 2018-11-26 (×2): 2 mg via INTRAVENOUS
  Administered 2018-11-26: 1 mg via INTRAVENOUS
  Administered 2018-11-26: 4 mg via INTRAVENOUS
  Administered 2018-11-26: 1 mg via INTRAVENOUS

## 2018-11-26 MED ORDER — ASPIRIN 81 MG PO CHEW: 324.0000 mg | CHEWABLE_TABLET | Freq: Every day | ORAL | Status: DC

## 2018-11-26 MED ORDER — ATORVASTATIN CALCIUM 80 MG PO TABS
80.0000 mg | ORAL_TABLET | Freq: Every day | ORAL | Status: DC
  Administered 2018-11-26 – 2018-11-29 (×4): 80 mg via ORAL
  Filled 2018-11-26 (×4): qty 1

## 2018-11-26 MED ORDER — MORPHINE SULFATE (PF) 2 MG/ML IV SOLN
1.0000 mg | INTRAVENOUS | Status: DC | PRN
  Administered 2018-11-27: 2 mg via INTRAVENOUS
  Administered 2018-11-27: 4 mg via INTRAVENOUS
  Filled 2018-11-26: qty 1

## 2018-11-26 MED ORDER — PANTOPRAZOLE SODIUM 40 MG PO TBEC
40.0000 mg | DELAYED_RELEASE_TABLET | Freq: Every day | ORAL | Status: DC
  Administered 2018-11-28 – 2018-11-30 (×3): 40 mg via ORAL
  Filled 2018-11-26 (×3): qty 1

## 2018-11-26 MED ORDER — CHLORHEXIDINE GLUCONATE CLOTH 2 % EX PADS
6.0000 | MEDICATED_PAD | Freq: Every day | CUTANEOUS | Status: DC
  Administered 2018-11-26 – 2018-11-27 (×2): 6 via TOPICAL

## 2018-11-26 MED ORDER — NITROGLYCERIN IN D5W 200-5 MCG/ML-% IV SOLN: 0.0000 ug/min | INTRAVENOUS | Status: DC

## 2018-11-26 MED ORDER — OXYCODONE HCL 5 MG PO TABS
5.0000 mg | ORAL_TABLET | ORAL | Status: DC | PRN
  Administered 2018-11-26 – 2018-11-27 (×3): 10 mg via ORAL
  Administered 2018-11-27: 5 mg via ORAL
  Filled 2018-11-26 (×3): qty 2
  Filled 2018-11-26: qty 1

## 2018-11-26 MED ORDER — SODIUM CHLORIDE 0.9 % IV SOLN
INTRAVENOUS | Status: DC | PRN
  Administered 2018-11-26: 12:00:00 via INTRAVENOUS

## 2018-11-26 MED ORDER — PROTAMINE SULFATE 10 MG/ML IV SOLN
INTRAVENOUS | Status: AC
  Filled 2018-11-26: qty 25

## 2018-11-26 MED ORDER — ORAL CARE MOUTH RINSE
15.0000 mL | Freq: Two times a day (BID) | OROMUCOSAL | Status: DC
  Administered 2018-11-26 – 2018-11-28 (×4): 15 mL via OROMUCOSAL

## 2018-11-26 MED ORDER — LACTATED RINGERS IV SOLN
INTRAVENOUS | Status: DC
  Administered 2018-11-28: 20 mL/h via INTRAVENOUS

## 2018-11-26 MED ORDER — BISACODYL 5 MG PO TBEC
10.0000 mg | DELAYED_RELEASE_TABLET | Freq: Every day | ORAL | Status: DC
  Administered 2018-11-27 – 2018-11-28 (×2): 10 mg via ORAL
  Filled 2018-11-26 (×2): qty 2

## 2018-11-26 MED ORDER — LACTATED RINGERS IV SOLN
INTRAVENOUS | Status: DC | PRN
  Administered 2018-11-26 (×2): via INTRAVENOUS

## 2018-11-26 MED ORDER — TRAMADOL HCL 50 MG PO TABS: 50.0000 mg | ORAL_TABLET | ORAL | Status: DC | PRN

## 2018-11-26 MED ORDER — ACETAMINOPHEN 160 MG/5ML PO SOLN: 650.0000 mg | Freq: Once | ORAL | Status: AC

## 2018-11-26 MED ORDER — SODIUM CHLORIDE 0.9% FLUSH
3.0000 mL | Freq: Two times a day (BID) | INTRAVENOUS | Status: DC
  Administered 2018-11-27 – 2018-11-29 (×6): 3 mL via INTRAVENOUS

## 2018-11-26 MED ORDER — SODIUM CHLORIDE 0.9 % IV SOLN: 250.0000 mL | INTRAVENOUS | Status: DC

## 2018-11-26 MED ORDER — MIDAZOLAM HCL (PF) 10 MG/2ML IJ SOLN
INTRAMUSCULAR | Status: AC
  Filled 2018-11-26: qty 2

## 2018-11-26 MED ORDER — ACETAMINOPHEN 650 MG RE SUPP
650.0000 mg | Freq: Once | RECTAL | Status: AC
  Administered 2018-11-26: 650 mg via RECTAL

## 2018-11-26 MED ORDER — METOPROLOL TARTRATE 5 MG/5ML IV SOLN: 2.5000 mg | INTRAVENOUS | Status: DC | PRN

## 2018-11-26 MED ORDER — HEPARIN SODIUM (PORCINE) 1000 UNIT/ML IJ SOLN
INTRAMUSCULAR | Status: DC | PRN
  Administered 2018-11-26: 40000 [IU] via INTRAVENOUS

## 2018-11-26 MED ORDER — METOPROLOL TARTRATE 12.5 MG HALF TABLET
12.5000 mg | ORAL_TABLET | Freq: Two times a day (BID) | ORAL | Status: DC
  Administered 2018-11-27 – 2018-11-28 (×3): 12.5 mg via ORAL
  Filled 2018-11-26 (×3): qty 1

## 2018-11-26 MED ORDER — SODIUM CHLORIDE 0.9 % IV SOLN: INTRAVENOUS | Status: DC | PRN

## 2018-11-26 MED ORDER — TRANEXAMIC ACID-NACL 1000-0.7 MG/100ML-% IV SOLN
1000.0000 mg | INTRAVENOUS | Status: DC
  Filled 2018-11-26: qty 100

## 2018-11-26 MED ORDER — ROCURONIUM BROMIDE 10 MG/ML (PF) SYRINGE
PREFILLED_SYRINGE | INTRAVENOUS | Status: DC | PRN
  Administered 2018-11-26 (×2): 50 mg via INTRAVENOUS
  Administered 2018-11-26: 100 mg via INTRAVENOUS
  Administered 2018-11-26: 50 mg via INTRAVENOUS

## 2018-11-26 MED ORDER — ONDANSETRON HCL 4 MG/2ML IJ SOLN
4.0000 mg | Freq: Four times a day (QID) | INTRAMUSCULAR | Status: DC | PRN
  Administered 2018-11-26 – 2018-11-27 (×2): 4 mg via INTRAVENOUS
  Filled 2018-11-26 (×2): qty 2

## 2018-11-26 MED ORDER — CHLORHEXIDINE GLUCONATE 4 % EX LIQD: 30.0000 mL | CUTANEOUS | Status: DC

## 2018-11-26 MED ORDER — ORAL CARE MOUTH RINSE
15.0000 mL | OROMUCOSAL | Status: DC
  Administered 2018-11-26 (×2): 15 mL via OROMUCOSAL

## 2018-11-26 MED ORDER — PROTAMINE SULFATE 10 MG/ML IV SOLN
INTRAVENOUS | Status: AC
  Filled 2018-11-26: qty 5

## 2018-11-26 MED ORDER — FENTANYL CITRATE (PF) 100 MCG/2ML IJ SOLN: 25.0000 ug | INTRAMUSCULAR | Status: DC | PRN

## 2018-11-26 MED ORDER — MIDAZOLAM HCL 2 MG/2ML IJ SOLN: 2.0000 mg | INTRAMUSCULAR | Status: DC | PRN

## 2018-11-26 SURGICAL SUPPLY — 105 items
BAG DECANTER FOR FLEXI CONT (MISCELLANEOUS) ×4 IMPLANT
BANDAGE ACE 4X5 VEL STRL LF (GAUZE/BANDAGES/DRESSINGS) ×4 IMPLANT
BANDAGE ACE 6X5 VEL STRL LF (GAUZE/BANDAGES/DRESSINGS) ×4 IMPLANT
BANDAGE ELASTIC 4 VELCRO ST LF (GAUZE/BANDAGES/DRESSINGS) ×4 IMPLANT
BANDAGE ELASTIC 6 VELCRO ST LF (GAUZE/BANDAGES/DRESSINGS) ×4 IMPLANT
BASKET HEART (ORDER IN 25'S) (MISCELLANEOUS) ×1
BASKET HEART (ORDER IN 25S) (MISCELLANEOUS) ×3 IMPLANT
BLADE STERNUM SYSTEM 6 (BLADE) ×4 IMPLANT
BNDG GAUZE ELAST 4 BULKY (GAUZE/BANDAGES/DRESSINGS) ×4 IMPLANT
CANISTER SUCT 3000ML PPV (MISCELLANEOUS) ×4 IMPLANT
CANNULA ARTERIAL NVNT 3/8 22FR (MISCELLANEOUS) ×4 IMPLANT
CATH ROBINSON RED A/P 18FR (CATHETERS) ×8 IMPLANT
CATH THORACIC 28FR (CATHETERS) ×4 IMPLANT
CATH THORACIC 36FR (CATHETERS) ×4 IMPLANT
CATH THORACIC 36FR RT ANG (CATHETERS) ×4 IMPLANT
CLIP VESOCCLUDE MED 24/CT (CLIP) ×8 IMPLANT
CLIP VESOCCLUDE SM WIDE 24/CT (CLIP) IMPLANT
COVER WAND RF STERILE (DRAPES) ×4 IMPLANT
CRADLE DONUT ADULT HEAD (MISCELLANEOUS) ×4 IMPLANT
DERMABOND ADVANCED (GAUZE/BANDAGES/DRESSINGS) ×1
DERMABOND ADVANCED .7 DNX12 (GAUZE/BANDAGES/DRESSINGS) ×3 IMPLANT
DRAPE CARDIOVASCULAR INCISE (DRAPES) ×1
DRAPE SLUSH/WARMER DISC (DRAPES) ×4 IMPLANT
DRAPE SRG 135X102X78XABS (DRAPES) ×3 IMPLANT
DRSG COVADERM 4X14 (GAUZE/BANDAGES/DRESSINGS) ×4 IMPLANT
ELECT CAUTERY BLADE 6.4 (BLADE) ×4 IMPLANT
ELECT REM PT RETURN 9FT ADLT (ELECTROSURGICAL) ×8
ELECTRODE REM PT RTRN 9FT ADLT (ELECTROSURGICAL) ×6 IMPLANT
FELT TEFLON 1X6 (MISCELLANEOUS) ×8 IMPLANT
GAUZE SPONGE 4X4 12PLY STRL (GAUZE/BANDAGES/DRESSINGS) ×8 IMPLANT
GAUZE SPONGE 4X4 12PLY STRL LF (GAUZE/BANDAGES/DRESSINGS) ×4 IMPLANT
GLOVE BIO SURGEON STRL SZ 6 (GLOVE) IMPLANT
GLOVE BIO SURGEON STRL SZ 6.5 (GLOVE) IMPLANT
GLOVE BIO SURGEON STRL SZ7 (GLOVE) IMPLANT
GLOVE BIO SURGEON STRL SZ7.5 (GLOVE) IMPLANT
GLOVE BIOGEL PI IND STRL 6 (GLOVE) ×6 IMPLANT
GLOVE BIOGEL PI IND STRL 6.5 (GLOVE) ×9 IMPLANT
GLOVE BIOGEL PI IND STRL 7.0 (GLOVE) ×6 IMPLANT
GLOVE BIOGEL PI INDICATOR 6 (GLOVE) ×2
GLOVE BIOGEL PI INDICATOR 6.5 (GLOVE) ×3
GLOVE BIOGEL PI INDICATOR 7.0 (GLOVE) ×2
GLOVE BIOGEL PI ORTHO PRO SZ7 (GLOVE) ×1
GLOVE EUDERMIC 7 POWDERFREE (GLOVE) ×8 IMPLANT
GLOVE ORTHO TXT STRL SZ7.5 (GLOVE) IMPLANT
GLOVE PI ORTHO PRO STRL SZ7 (GLOVE) ×3 IMPLANT
GOWN STRL REUS W/ TWL LRG LVL3 (GOWN DISPOSABLE) ×12 IMPLANT
GOWN STRL REUS W/ TWL XL LVL3 (GOWN DISPOSABLE) ×3 IMPLANT
GOWN STRL REUS W/TWL LRG LVL3 (GOWN DISPOSABLE) ×4
GOWN STRL REUS W/TWL XL LVL3 (GOWN DISPOSABLE) ×1
HEMOSTAT POWDER SURGIFOAM 1G (HEMOSTASIS) ×12 IMPLANT
HEMOSTAT SURGICEL 2X14 (HEMOSTASIS) ×4 IMPLANT
INSERT FOGARTY 61MM (MISCELLANEOUS) IMPLANT
INSERT FOGARTY XLG (MISCELLANEOUS) IMPLANT
KIT BASIN OR (CUSTOM PROCEDURE TRAY) ×4 IMPLANT
KIT CATH CPB BARTLE (MISCELLANEOUS) ×4 IMPLANT
KIT SUCTION CATH 14FR (SUCTIONS) ×4 IMPLANT
KIT TURNOVER KIT B (KITS) ×4 IMPLANT
KIT VASOVIEW HEMOPRO 2 VH 4000 (KITS) ×4 IMPLANT
NS IRRIG 1000ML POUR BTL (IV SOLUTION) ×20 IMPLANT
PACK E OPEN HEART (SUTURE) ×4 IMPLANT
PACK OPEN HEART (CUSTOM PROCEDURE TRAY) ×4 IMPLANT
PAD ARMBOARD 7.5X6 YLW CONV (MISCELLANEOUS) ×8 IMPLANT
PAD ELECT DEFIB RADIOL ZOLL (MISCELLANEOUS) ×4 IMPLANT
PENCIL BUTTON HOLSTER BLD 10FT (ELECTRODE) ×4 IMPLANT
PUNCH AORTIC ROTATE 4.0MM (MISCELLANEOUS) IMPLANT
PUNCH AORTIC ROTATE 4.5MM 8IN (MISCELLANEOUS) ×4 IMPLANT
PUNCH AORTIC ROTATE 5MM 8IN (MISCELLANEOUS) IMPLANT
SOLUTION ANTI FOG 6CC (MISCELLANEOUS) ×4 IMPLANT
SPONGE INTESTINAL PEANUT (DISPOSABLE) IMPLANT
SPONGE LAP 18X18 X RAY DECT (DISPOSABLE) IMPLANT
SPONGE LAP 4X18 RFD (DISPOSABLE) ×4 IMPLANT
SUT BONE WAX W31G (SUTURE) ×4 IMPLANT
SUT MNCRL AB 4-0 PS2 18 (SUTURE) IMPLANT
SUT PROLENE 3 0 SH DA (SUTURE) IMPLANT
SUT PROLENE 3 0 SH1 36 (SUTURE) ×4 IMPLANT
SUT PROLENE 4 0 RB 1 (SUTURE)
SUT PROLENE 4 0 SH DA (SUTURE) IMPLANT
SUT PROLENE 4-0 RB1 .5 CRCL 36 (SUTURE) IMPLANT
SUT PROLENE 5 0 C 1 36 (SUTURE) IMPLANT
SUT PROLENE 6 0 C 1 30 (SUTURE) IMPLANT
SUT PROLENE 7 0 BV 1 (SUTURE) IMPLANT
SUT PROLENE 7 0 BV1 MDA (SUTURE) ×4 IMPLANT
SUT PROLENE 8 0 BV175 6 (SUTURE) IMPLANT
SUT SILK  1 MH (SUTURE)
SUT SILK 1 MH (SUTURE) IMPLANT
SUT SILK 2 0 SH (SUTURE) IMPLANT
SUT STEEL STERNAL CCS#1 18IN (SUTURE) IMPLANT
SUT STEEL SZ 6 DBL 3X14 BALL (SUTURE) IMPLANT
SUT VIC AB 1 CTX 36 (SUTURE) ×2
SUT VIC AB 1 CTX36XBRD ANBCTR (SUTURE) ×6 IMPLANT
SUT VIC AB 2-0 CT1 27 (SUTURE)
SUT VIC AB 2-0 CT1 TAPERPNT 27 (SUTURE) IMPLANT
SUT VIC AB 2-0 CTX 27 (SUTURE) IMPLANT
SUT VIC AB 3-0 SH 27 (SUTURE)
SUT VIC AB 3-0 SH 27X BRD (SUTURE) IMPLANT
SUT VIC AB 3-0 X1 27 (SUTURE) IMPLANT
SUT VICRYL 4-0 PS2 18IN ABS (SUTURE) IMPLANT
SYSTEM SAHARA CHEST DRAIN ATS (WOUND CARE) ×4 IMPLANT
TAPE CLOTH SURG 4X10 WHT LF (GAUZE/BANDAGES/DRESSINGS) ×4 IMPLANT
TOWEL GREEN STERILE (TOWEL DISPOSABLE) ×4 IMPLANT
TOWEL GREEN STERILE FF (TOWEL DISPOSABLE) ×4 IMPLANT
TRAY FOLEY SLVR 16FR TEMP STAT (SET/KITS/TRAYS/PACK) ×4 IMPLANT
TUBING INSUFFLATION (TUBING) ×4 IMPLANT
UNDERPAD 30X30 (UNDERPADS AND DIAPERS) ×4 IMPLANT
WATER STERILE IRR 1000ML POUR (IV SOLUTION) ×8 IMPLANT

## 2018-11-26 NOTE — Progress Notes (Signed)
  Echocardiogram 2D Echocardiogram has been performed.  Gabriel Green 11/26/2018, 9:16 AM

## 2018-11-26 NOTE — Anesthesia Procedure Notes (Signed)
Procedure Name: Intubation Date/Time: 11/26/2018 8:03 AM Performed by: Elayne Snare, CRNA Pre-anesthesia Checklist: Patient identified, Emergency Drugs available, Suction available and Patient being monitored Patient Re-evaluated:Patient Re-evaluated prior to induction Oxygen Delivery Method: Circle System Utilized Preoxygenation: Pre-oxygenation with 100% oxygen Induction Type: IV induction Laryngoscope Size: Mac and 4 Grade View: Grade I Tube type: Oral Tube size: 8.0 mm Number of attempts: 1 Airway Equipment and Method: Stylet and Oral airway Placement Confirmation: ETT inserted through vocal cords under direct vision,  positive ETCO2 and breath sounds checked- equal and bilateral Secured at: 23 cm Tube secured with: Tape Dental Injury: Teeth and Oropharynx as per pre-operative assessment

## 2018-11-26 NOTE — Brief Op Note (Signed)
11/26/2018  11:27 AM  PATIENT:  [name]  58 y.o. male  PRE-OPERATIVE DIAGNOSIS:  CAD  POST-OPERATIVE DIAGNOSIS:  CAD  PROCEDURE:  Procedure(s): CORONARY ARTERY BYPASS GRAFTING (CABG) (N/A) TRANSESOPHAGEAL ECHOCARDIOGRAM (TEE) (N/A) Endovein Harvest Of Greater Saphenous Vein (Right)  LIMA->LAD SVG->D1 SVG->RI SVG->PDA  SURGEON:  Surgeon(s) and Role:    * Bartle, Payton Doughty, MD - Primary  PHYSICIAN ASSISTANT: Shayley Medlin, PA-C   ANESTHESIA:   general  EBL:  Per anesthesia and perfusion records   BLOOD ADMINISTERED:none  DRAINS: Mediastinal and left pleural drains   LOCAL MEDICATIONS USED:  NONE  SPECIMEN:  No Specimen  DISPOSITION OF SPECIMEN:  N/A  COUNTS:  YES  TOURNIQUET:  * No tourniquets in log *  DICTATION: .Dragon Dictation  PLAN OF CARE: Admit to inpatient   PATIENT DISPOSITION:  ICU - intubated and hemodynamically stable.   Delay start of Pharmacological VTE agent (>24hrs) due to surgical blood loss or risk of bleeding: yes

## 2018-11-26 NOTE — Anesthesia Procedure Notes (Addendum)
Central Venous Catheter Insertion Performed by: Annye Asa, MD, anesthesiologist Start/End1/30/2020 6:24 AM, 11/26/2018 6:36 AM Patient location: Pre-op. Preanesthetic checklist: patient identified, IV checked, risks and benefits discussed, surgical consent, monitors and equipment checked, pre-op evaluation, timeout performed and anesthesia consent Position: Trendelenburg Lidocaine 1% used for infiltration and patient sedated Hand hygiene performed , maximum sterile barriers used  and Seldinger technique used Catheter size: 8.5 Fr PA cath was placed.Sheath introducer Swan type:thermodilution Procedure performed using ultrasound guided technique. Ultrasound Notes:anatomy identified, needle tip was noted to be adjacent to the nerve/plexus identified, no ultrasound evidence of intravascular and/or intraneural injection and image(s) printed for medical record Attempts: 1 Following insertion, line sutured, dressing applied and Biopatch. Post procedure assessment: blood return through all ports, free fluid flow and no air  Patient tolerated the procedure well with no immediate complications. Additional procedure comments: PA catheter:  Routine monitors. Timeout, sterile prep, drape, FBP R neck.  Trendelenburg position.  1% Lido local, finder and trocar RIJ 1st pass with US guidance.  Cordis placed over J wire. PA catheter in easily.  Sterile dressing applied.  Patient tolerated well, VSS.  Jenita Seashore, MD.

## 2018-11-26 NOTE — Procedures (Signed)
Extubation Procedure Note  Patient Details:   Name: Gabriel Green DOB: Sep 02, 1961 MRN: [id number]   Airway Documentation:    Vent end date: 11/26/18 Vent end time: 1634   Evaluation  O2 sats: stable throughout Complications: No apparent complications Patient did tolerate procedure well. Bilateral Breath Sounds: Clear, Diminished   Yes   Patient extubated to 2L nasal cannula per protocol.  Positive cuff leak noted.  No evidence of stridor.  Patient able to speak post extubation.  Sats currently 97%.  NIF -40, VC of 1.0L.  Incentive spirometry performed with RN with achieved goal of 750.  No complications noted.   Elyn Peers 11/26/2018, 4:59 PM

## 2018-11-26 NOTE — Anesthesia Procedure Notes (Signed)
Arterial Line Insertion Start/End1/30/2020 6:50 AM Performed by: Janora Norlander, CRNA, CRNA  Preanesthetic checklist: patient identified, IV checked, risks and benefits discussed and surgical consent Lidocaine 1% used for infiltration Left, radial was placed Catheter size: 20 G Hand hygiene performed  and maximum sterile barriers used  Allen's test indicative of satisfactory collateral circulation Attempts: 1 Procedure performed without using ultrasound guided technique. Following insertion, dressing applied and Biopatch. Post procedure assessment: normal  Patient tolerated the procedure well with no immediate complications.

## 2018-11-26 NOTE — Anesthesia Postprocedure Evaluation (Signed)
Anesthesia Post Note  Patient: Gabriel Green  Procedure(s) Performed: CORONARY ARTERY BYPASS GRAFTING (CABG) times three (N/A Chest) TRANSESOPHAGEAL ECHOCARDIOGRAM (TEE) (N/A ) Endovein Harvest Of Greater Saphenous Vein (Right Leg Upper)     Patient location during evaluation: SICU Anesthesia Type: General Level of consciousness: patient remains intubated per anesthesia plan Pain management: pain level controlled Vital Signs Assessment: post-procedure vital signs reviewed and stable Respiratory status: patient remains intubated per anesthesia plan Cardiovascular status: stable Postop Assessment: no apparent nausea or vomiting Anesthetic complications: no    Last Vitals:  Vitals:   11/26/18 0600 11/26/18 1318  BP: 129/83 (!) 90/54  Pulse: 84 81  Resp:  (!) 22  Temp: 36.7 C   SpO2:  97%    Last Pain:  Vitals:   11/26/18 0600  TempSrc: Oral  PainSc:                  Sharolyn Weber

## 2018-11-26 NOTE — Progress Notes (Signed)
Patient ID: Gabriel Green, male   DOB: 12/11/1960, 58 y.o.   MRN: [id number]  TCTS Evening Rounds:   Hemodynamically stable  CI = 2.4  Extubated and awake  Urine output good  CT output low  CBC    Component Value Date/Time   WBC 20.2 (H) 11/26/2018 1316   RBC 3.69 (L) 11/26/2018 1316   HGB 12.0 (L) 11/26/2018 1316   HCT 36.8 (L) 11/26/2018 1316   PLT 241 11/26/2018 1316   MCV 99.7 11/26/2018 1316   MCH 32.5 11/26/2018 1316   MCHC 32.6 11/26/2018 1316   RDW 12.4 11/26/2018 1316   LYMPHSABS 4.3 (H) 11/14/2018 0416   MONOABS 0.7 11/14/2018 0416   EOSABS 0.4 11/14/2018 0416   BASOSABS 0.1 11/14/2018 0416     BMET    Component Value Date/Time   NA 137 11/26/2018 1223   K 5.1 11/26/2018 1223   CL 108 11/17/2018 0844   CO2 19 (L) 11/17/2018 0844   GLUCOSE 139 (H) 11/26/2018 1223   BUN 8 11/17/2018 0844   CREATININE 0.88 11/17/2018 0844   CALCIUM 9.1 11/17/2018 0844   GFRNONAA >60 11/17/2018 0844   GFRAA >60 11/17/2018 0844     A/P:  Stable postop course. Continue current plans

## 2018-11-26 NOTE — Progress Notes (Signed)
RT note: rapid wean protocol initiated at 1543.

## 2018-11-26 NOTE — Interval H&P Note (Signed)
History and Physical Interval Note:  11/26/2018 7:44 AM  Gabriel Green  has presented today for surgery, with the diagnosis of CAD  The various methods of treatment have been discussed with the patient and family. After consideration of risks, benefits and other options for treatment, the patient has consented to  Procedure(s): CORONARY ARTERY BYPASS GRAFTING (CABG) (N/A) TRANSESOPHAGEAL ECHOCARDIOGRAM (TEE) (N/A) as a surgical intervention .  The patient's history has been reviewed, patient examined, no change in status, stable for surgery.  I have reviewed the patient's chart and labs.  Questions were answered to the patient's satisfaction.     Alleen BorneBryan K Alahni Varone

## 2018-11-26 NOTE — Transfer of Care (Signed)
Immediate Anesthesia Transfer of Care Note  Patient: Gabriel Green  Procedure(s) Performed: CORONARY ARTERY BYPASS GRAFTING (CABG) times three (N/A Chest) TRANSESOPHAGEAL ECHOCARDIOGRAM (TEE) (N/A ) Endovein Harvest Of Greater Saphenous Vein (Right Leg Upper)  Patient Location: ICU  Anesthesia Type:General  Level of Consciousness: Patient remains intubated per anesthesia plan  Airway & Oxygen Therapy: Patient remains intubated per anesthesia plan and Patient placed on Ventilator (see vital sign flow sheet for setting)  Post-op Assessment: Report given to RN and Post -op Vital signs reviewed and stable  Post vital signs: Reviewed and stable  Last Vitals:  Vitals Value Taken Time  BP    Temp    Pulse    Resp 21 11/26/2018  1:21 PM  SpO2    Vitals shown include unvalidated device data.  Last Pain:  Vitals:   11/26/18 0600  TempSrc: Oral  PainSc:          Complications: No apparent anesthesia complications

## 2018-11-26 NOTE — Op Note (Signed)
CARDIOVASCULAR SURGERY OPERATIVE NOTE  11/26/2018  Surgeon:  Alleen Borne, MD  First Assistant: Jillyn Hidden,  PA-C   Preoperative Diagnosis:  Severe multi-vessel coronary artery disease   Postoperative Diagnosis:  Same   Procedure:  1. Median Sternotomy 2. Extracorporeal circulation 3.   Coronary artery bypass grafting x 4   Left internal mammary graft to the LAD  SVG to diagonal  SVG to OM1  SVG to PDA 4.   Endoscopic vein harvest from the right leg   Anesthesia:  General Endotracheal   Clinical History/Surgical Indication:  The patient is a 58 year old gentleman with history of hypertension, borderline diabetes, and obesity, 1 pack/day smoking, and a family history of heart disease who reports about 53-month history of intermittent chest discomfort associated with shortness of breath typically occurring with exertion like walking up steep steps on a crane with a 20 pound robot.He was also having episodes of substernal chest discomfort radiating into his forearms at night when laying down in bed usually relieved with sitting up. He was recently traveling in [place] for work and developed chest discomfort and presented to an emergency department there on 11/09/2017. He was found to have an elevated HStroponin. It eventually peaked at 1797.An echocardiogram showed normal ejection fraction. Cardiac catheterization on 115 reportedly showed severe multivessel disease.We do not have the cardiac catheterization films yet but have a copy of the cath report. There was apparently dampening of the waveform with engagement of the left main. CABG was recommended and he was seen by CT surgery there but desired to come home and opted to leave AMA. He flew home and presented to the emergency department here on 11/13/2018. His initial troponin was 0.52 and has decreased to 0.31. He  remained chest pain free for the past week. He did not have a copy of his cath films from [place] and since he was pain free and stable he was sent home by cardiology with plans for surgery the following week. His cath films were requested from [place] but did not arrive and therefore his surgery was postponed a couple times. We just received his cath films on 11/25/2017 and after review it was apparent that he required CABG and that PCI was not an option. I discussed the operative procedure with the patient including alternatives, benefits and risks; including but not limited to bleeding, blood transfusion, infection, stroke, myocardial infarction, graft failure, heart block requiring a permanent pacemaker, organ dysfunction, and death.Gabriel Curtisunderstands and agrees to proceed.    Preparation:  The patient was seen in the preoperative holding area and the correct patient, correct operation were confirmed with the patient after reviewing the medical record and catheterization. The consent was signed by me. Preoperative antibiotics were given. A pulmonary arterial line and radial arterial line were placed by the anesthesia team. The patient was taken back to the operating room and positioned supine on the operating room table. After being placed under general endotracheal anesthesia by the anesthesia team a foley catheter was placed. The neck, chest, abdomen, and both legs were prepped with betadine soap and solution and draped in the usual sterile manner. A surgical time-out was taken and the correct patient and operative procedure were confirmed with the nursing and anesthesia staff.   Cardiopulmonary Bypass:  A median sternotomy was performed. The pericardium was opened in the midline. Right ventricular function appeared normal. The ascending aorta was of normal size and had no palpable plaque. There were no contraindications to aortic cannulation or cross-clamping.  The patient was fully  systemically heparinized and the ACT was maintained > 400 sec. The proximal aortic arch was cannulated with a 22 F aortic cannula for arterial inflow. Venous cannulation was performed via the right atrial appendage using a two-staged venous cannula. An antegrade cardioplegia/vent cannula was inserted into the mid-ascending aorta. Aortic occlusion was performed with a single cross-clamp. Systemic cooling to 32 degrees Centigrade and topical cooling of the heart with iced saline were used. Hyperkalemic antegrade cold blood cardioplegia was used to induce diastolic arrest and was then given at about 20 minute intervals throughout the period of arrest to maintain myocardial temperature at or below 10 degrees centigrade. A temperature probe was inserted into the interventricular septum and an insulating pad was placed in the pericardium.   Left internal mammary harvest:  The left side of the sternum was retracted using the Rultract retractor. The left internal mammary artery was harvested as a pedicle graft. All side branches were clipped. It was a medium-sized vessel of good quality with excellent blood flow. It was ligated distally and divided. It was sprayed with topical papaverine solution to prevent vasospasm.   Endoscopic vein harvest:  The right greater saphenous vein was harvested endoscopically through a 2 cm incision medial to the right knee. It was harvested from the upper thigh to below the knee. It was a medium-sized vein of good quality. The side branches were all ligated with 4-0 silk ties.    Coronary arteries:  The coronary arteries were examined.   LAD:  Large vessel in the mid portion but small after the second diagonal branch. Mild segmental distal disease. The D1 was large with no distal disease.  LCX:  OM1 intramyocardial but visible proximally where it was a medium sized distal vessel. Small OM2  RCA:  Diffusely diseased throughout the proximal and mid portion extending to the  origin of the PDA. The PDA and PL were both medium caliber vessels with no distal disease and communicated well on cath.   Grafts:  1. LIMA to the LAD: 2.0 mm. It was sewn end to side using 8-0 prolene continuous suture. 2. SVG to D1:  1.75 mm. It was sewn end to side using 7-0 prolene continuous suture. 3. SVG to OM1:  1.6 mm. It was sewn end to side using 7-0 prolene continuous suture. 4. SVG to PDA:  1.75 mm. It was sewn end to side using 7-0 prolene continuous suture.  The proximal vein graft anastomoses were performed to the mid-ascending aorta using continuous 6-0 prolene suture. Graft markers were placed around the proximal anastomoses.   Completion:  The patient was rewarmed to 37 degrees Centigrade. The clamp was removed from the LIMA pedicle and there was rapid warming of the septum and return of ventricular fibrillation. The crossclamp was removed with a time of 84 minutes. There was spontaneous return of sinus rhythm. The distal and proximal anastomoses were checked for hemostasis. The position of the grafts was satisfactory. Two temporary epicardial pacing wires were placed on the right atrium and two on the right ventricle. The patient was weaned from CPB without difficulty on no inotropes. CPB time was 100 minutes. Cardiac output was 5 LPM. TEE showed normal LV systolic function.  Heparin was fully reversed with protamine and the aortic and venous cannulas removed. Hemostasis was achieved. Mediastinal and left pleural drainage tubes were placed. The sternum was closed with double #6 stainless steel wires. The fascia was closed with continuous # 1 vicryl suture. The  subcutaneous tissue was closed with 2-0 vicryl continuous suture. The skin was closed with 3-0 vicryl subcuticular suture. All sponge, needle, and instrument counts were reported correct at the end of the case. Dry sterile dressings were placed over the incisions and around the chest tubes which were connected to pleurevac  suction. The patient was then transported to the surgical intensive care unit in stable condition.

## 2018-11-27 ENCOUNTER — Inpatient Hospital Stay (HOSPITAL_COMMUNITY): Payer: 59

## 2018-11-27 ENCOUNTER — Encounter (HOSPITAL_COMMUNITY): Payer: Self-pay | Admitting: Surgery

## 2018-11-27 LAB — BASIC METABOLIC PANEL
Anion gap: 9 (ref 5–15)
Anion gap: 8 (ref 5–15)
BUN: 8 mg/dL (ref 6–20)
BUN: 10 mg/dL (ref 6–20)
CALCIUM: 8.2 mg/dL — AB (ref 8.9–10.3)
CO2: 21 mmol/L — ABNORMAL LOW (ref 22–32)
CO2: 24 mmol/L (ref 22–32)
Calcium: 8.7 mg/dL — ABNORMAL LOW (ref 8.9–10.3)
Chloride: 110 mmol/L (ref 98–111)
Chloride: 106 mmol/L (ref 98–111)
Creatinine, Ser: 0.97 mg/dL (ref 0.61–1.24)
Creatinine, Ser: 0.95 mg/dL (ref 0.61–1.24)
GFR calc Af Amer: >60 mL/min (ref >60)
GFR calc non Af Amer: >60 mL/min (ref >60)
GFR calc non Af Amer: >60 mL/min (ref >60)
Glucose, Bld: 117 mg/dL — ABNORMAL HIGH (ref 70–99)
Glucose, Bld: 154 mg/dL — ABNORMAL HIGH (ref 70–99)
POTASSIUM: 4.2 mmol/L (ref 3.5–5.1)
Potassium: 4 mmol/L (ref 3.5–5.1)
SODIUM: 140 mmol/L (ref 135–145)
SODIUM: 138 mmol/L (ref 135–145)

## 2018-11-27 LAB — CBC
HCT: 37.3 % — ABNORMAL LOW (ref 39.0–52.0)
HCT: 39.2 % (ref 39.0–52.0)
Hemoglobin: 11.9 g/dL — ABNORMAL LOW (ref 13.0–17.0)
Hemoglobin: 12.5 g/dL — ABNORMAL LOW (ref 13.0–17.0)
MCH: 31.7 pg (ref 26.0–34.0)
MCH: 32.1 pg (ref 26.0–34.0)
MCHC: 31.9 g/dL (ref 30.0–36.0)
MCHC: 31.9 g/dL (ref 30.0–36.0)
MCV: 99.5 fL (ref 80.0–100.0)
MCV: 100.5 fL — ABNORMAL HIGH (ref 80.0–100.0)
Platelets: 251 10*3/uL (ref 150–400)
Platelets: 268 10*3/uL (ref 150–400)
RBC: 3.75 MIL/uL — ABNORMAL LOW (ref 4.22–5.81)
RBC: 3.9 MIL/uL — ABNORMAL LOW (ref 4.22–5.81)
RDW: 12.6 % (ref 11.5–15.5)
RDW: 12.7 % (ref 11.5–15.5)
WBC: 14.4 10*3/uL — AB (ref 4.0–10.5)
WBC: 17.1 10*3/uL — ABNORMAL HIGH (ref 4.0–10.5)
nRBC: 0 % (ref 0.0–0.2)
nRBC: 0 % (ref 0.0–0.2)

## 2018-11-27 LAB — MAGNESIUM
MAGNESIUM: 2.3 mg/dL (ref 1.7–2.4)
Magnesium: 2.4 mg/dL (ref 1.7–2.4)

## 2018-11-27 LAB — GLUCOSE, CAPILLARY
GLUCOSE-CAPILLARY: 122 mg/dL — AB (ref 70–99)
GLUCOSE-CAPILLARY: 106 mg/dL — AB (ref 70–99)
Glucose-Capillary: 112 mg/dL — ABNORMAL HIGH (ref 70–99)
Glucose-Capillary: 112 mg/dL — ABNORMAL HIGH (ref 70–99)
Glucose-Capillary: 117 mg/dL — ABNORMAL HIGH (ref 70–99)
Glucose-Capillary: 127 mg/dL — ABNORMAL HIGH (ref 70–99)
Glucose-Capillary: 134 mg/dL — ABNORMAL HIGH (ref 70–99)
Glucose-Capillary: 135 mg/dL — ABNORMAL HIGH (ref 70–99)
Glucose-Capillary: 149 mg/dL — ABNORMAL HIGH (ref 70–99)
Glucose-Capillary: 98 mg/dL (ref 70–99)

## 2018-11-27 MED ORDER — INSULIN ASPART 100 UNIT/ML ~~LOC~~ SOLN
0.0000 [IU] | SUBCUTANEOUS | Status: DC
  Administered 2018-11-27 – 2018-11-28 (×3): 2 [IU] via SUBCUTANEOUS

## 2018-11-27 MED ORDER — FUROSEMIDE 10 MG/ML IJ SOLN
40.0000 mg | Freq: Once | INTRAMUSCULAR | Status: AC
  Administered 2018-11-27: 40 mg via INTRAVENOUS
  Filled 2018-11-27: qty 4

## 2018-11-27 MED ORDER — ENOXAPARIN SODIUM 40 MG/0.4ML ~~LOC~~ SOLN
40.0000 mg | Freq: Every day | SUBCUTANEOUS | Status: DC
  Administered 2018-11-27: 40 mg via SUBCUTANEOUS
  Filled 2018-11-27: qty 0.4

## 2018-11-27 MED ORDER — INSULIN ASPART 100 UNIT/ML ~~LOC~~ SOLN
0.0000 [IU] | SUBCUTANEOUS | Status: DC
  Administered 2018-11-27 (×2): 2 [IU] via SUBCUTANEOUS

## 2018-11-27 MED ORDER — TRAMADOL HCL 50 MG PO TABS
50.0000 mg | ORAL_TABLET | ORAL | Status: DC | PRN
  Administered 2018-11-28: 50 mg via ORAL
  Filled 2018-11-27: qty 1

## 2018-11-27 MED ORDER — INSULIN ASPART 100 UNIT/ML ~~LOC~~ SOLN: 0.0000 [IU] | SUBCUTANEOUS | Status: DC

## 2018-11-27 MED ORDER — POTASSIUM CHLORIDE CRYS ER 20 MEQ PO TBCR
20.0000 meq | EXTENDED_RELEASE_TABLET | Freq: Three times a day (TID) | ORAL | Status: AC
  Administered 2018-11-27 (×3): 20 meq via ORAL
  Filled 2018-11-27 (×3): qty 1

## 2018-11-27 MED FILL — Magnesium Sulfate Inj 50%: INTRAMUSCULAR | Qty: 10 | Status: AC

## 2018-11-27 MED FILL — Heparin Sodium (Porcine) Inj 1000 Unit/ML: INTRAMUSCULAR | Qty: 30 | Status: AC

## 2018-11-27 MED FILL — Thrombin (Recombinant) For Soln 20000 Unit: CUTANEOUS | Qty: 1 | Status: AC

## 2018-11-27 MED FILL — Potassium Chloride Inj 2 mEq/ML: INTRAVENOUS | Qty: 40 | Status: AC

## 2018-11-27 NOTE — Discharge Summary (Signed)
Physician Discharge Summary  Patient ID: Gabriel Green MRN: [id number] DOB/AGE: 1961-01-28 58 y.o.  Admit date: 11/26/2018 Discharge date: 11/30/2018  Admission Diagnoses:  Patient Active Problem List   Diagnosis Date Noted  . CAD, multiple vessel 11/14/2018  . NSTEMI (non-ST elevated myocardial infarction) (HCC) 11/13/2018   Discharge Diagnoses:   Patient Active Problem List   Diagnosis Date Noted  . S/P CABG x 4 11/26/2018  . CAD, multiple vessel 11/14/2018  . NSTEMI (non-ST elevated myocardial infarction) (HCC) 11/13/2018   Discharged Condition: good  History of Present Illness:   Mr. Gabriel Green is a 58 yo male with known history of HTN, borderline DM, obesity, nicotine abuse, and positive FH of heart disease.  He presented with complaints of 2 months of intermittent chest discomfort associated with shortness of breath typically with exertion.  He also experienced episodes of substernal chest pain with radiation into his forearms at night when laying down in bed, which relieved with sitting up.  He recently developed chest pain while being in [place] for work and developed chest pain prompting him to the ED.  Work up showed + NSTEMI.  Cardiac catheterization was done and showed multivessel CAD.  It was felt CABG would be indicated.  However, the patient signed out AMA and opted to come home and have surgery performed here.  He was evaluated by Dr. Laneta SimmersBartle at which time he denied any further episodes of chest pain.  It was felt he should have coronary bypass grafting.  The risks and benefits of the procedure were explained to the patient and he was agreeable to proceed.  Hospital Course:   Mr. Gabriel Green [facility].  He was taken to the operating room and underwent CABG x 4 utilizing LIMA to LAD, SVG to Diagonal, SVG to OM1, and SVG to PDA.  He also underwent endoscopic harvest of greater saphenous vein from his right leg.  He tolerated the procedure without  difficulty and was taken to the SICU in stable condition.  He was extubated the evening of surgery.  During his stay in the SICU the patients chest tubes and arterial lines were removed without difficulty.   On postop day 2, he was transferred to intermediate care where he continued to make a progressive and uneventful recovery.  He did not have any significant arrhythmias.  Pacer wires removed on postop day 3.  The patient remains in NSR.  He is ambulating independently.  His incisions are healing without evidence of infection.  He is medically stable for discharge home today.  Treatments: surgery:   1. Median Sternotomy 2. Extracorporeal circulation 3.   Coronary artery bypass grafting x 4   Left internal mammary graft to the LAD  SVG to diagonal  SVG to OM1  SVG to PDA 4.   Endoscopic vein harvest from the right leg  Discharge Exam: Blood pressure 112/72, pulse 75, temperature 98.1 F (36.7 C), temperature source Oral, resp. rate 18, height 5\' 9"  (1.753 m), weight 111.2 kg, SpO2 95 %.   Discharge disposition: 01-Home or Self Care   Discharge Medications:  The patient has been discharged on:   1.Beta Blocker:  Yes [ X  ]                              No   [   ]  If No, reason:  2.Ace Inhibitor/ARB: Yes [   ]                                     No  [ X   ]                                     If No, reason: no labile  3.Statin:   Yes [ x  ]                  No  [   ]                  If No, reason:  4.Ecasa:  Yes  [ x  ]                  No   [   ]                  If No, reason:      Allergies as of 11/30/2018   No Known Allergies     Medication List    STOP taking these medications   nitroGLYCERIN 0.4 MG SL tablet Commonly known as:  NITROSTAT     TAKE these medications   acetaminophen 500 MG tablet Commonly known as:  TYLENOL Take 2 tablets (1,000 mg total) by mouth every 6 (six) hours as needed.   aspirin 81 MG EC  tablet Take 1 tablet (81 mg total) by mouth daily.   atorvastatin 80 MG tablet Commonly known as:  LIPITOR Take 1 tablet (80 mg total) by mouth daily at 6 PM.   carvedilol 3.125 MG tablet Commonly known as:  COREG Take 1 tablet (3.125 mg total) by mouth 2 (two) times daily with a meal.   furosemide 40 MG tablet Commonly known as:  LASIX Take 1 tablet (40 mg total) by mouth daily.   oxyCODONE 5 MG immediate release tablet Commonly known as:  Oxy IR/ROXICODONE Take 1-2 tablets (5-10 mg total) by mouth every 4 (four) hours as needed for severe pain.   potassium chloride SA 20 MEQ tablet Commonly known as:  K-DUR,KLOR-CON Take 1 tablet (20 mEq total) by mouth daily.      Follow-up Information    Alleen Borne, MD Follow up on 12/30/2018.   Specialty:  Cardiothoracic Surgery Why:  Appointment is at 1:00, please get CXR at 12:30 at [facility] located on first floor of our office building Contact information: [address] [place] [address] [phone]        [facility] Follow up on 12/07/2018.   Specialty:  Cardiothoracic Surgery Why:  Appointment is at 10:15 for suture removal with nurse Contact information: [address] [place], [address] [place] [address] [phone]       Leone Brand, NP Follow up on 12/15/2018.   Specialties:  Cardiology, Radiology Why:  Appointment is at 11:30 Contact information: [address] [place] [address] [phone]           Signed: Lowella Dandy 11/30/2018, 7:48 AM

## 2018-11-27 NOTE — Progress Notes (Signed)
1 Day Post-Op Procedure(s) (LRB): CORONARY ARTERY BYPASS GRAFTING (CABG) times three (N/A) TRANSESOPHAGEAL ECHOCARDIOGRAM (TEE) (N/A) Endovein Harvest Of Greater Saphenous Vein (Right) Subjective: No complaints. Pain controlled  Objective: Vital signs in last 24 hours: Temp:  [95.5 F (35.3 C)-100.4 F (38 C)] 99.3 F (37.4 C) (01/31 0700) Pulse Rate:  [81-95] 90 (01/31 0700) Cardiac Rhythm: Atrial paced (01/31 0400) Resp:  [12-28] 19 (01/31 0700) BP: (80-121)/(54-75) 112/63 (01/31 0700) SpO2:  [92 %-97 %] 93 % (01/31 0700) Arterial Line BP: (86-157)/(47-63) 123/54 (01/31 0700) FiO2 (%):  [40 %-50 %] 40 % (01/30 1607) Weight:  [112.3 kg] 112.3 kg (01/31 0600)  Hemodynamic parameters for last 24 hours: PAP: (20-33)/(9-17) 25/10 CO:  [4.1 L/min-7.1 L/min] 5.6 L/min CI:  [1.8 L/min/m2-3.2 L/min/m2] 2.5 L/min/m2  Intake/Output from previous day: 01/30 0701 - 01/31 0700 In: 5160.5 [I.V.:3746.1; Blood:120; IV Piggyback:1294.4] Out: 3495 [Urine:2595; Blood:450; Chest Tube:450] Intake/Output this shift: No intake/output data recorded.  General appearance: alert and cooperative Neurologic: intact Heart: regular rate and rhythm, S1, S2 normal, no murmur, click, rub or gallop Lungs: clear to auscultation bilaterally Extremities: edema mild Wound: dressings dry  Lab Results: Recent Labs    11/26/18 2000 11/27/18 0400  WBC 18.1* 14.4*  HGB 12.4* 11.9*  HCT 37.4* 37.3*  PLT 279 251   BMET:  Recent Labs    11/26/18 2000 11/27/18 0400  NA 136 140  K 4.4 4.0  CL 109 110  CO2 21* 21*  GLUCOSE 124* 117*  BUN 9 8  CREATININE 0.97 0.97  CALCIUM 8.2* 8.2*    PT/INR:  Recent Labs    11/26/18 1316  LABPROT 17.3*  INR 1.43   ABG    Component Value Date/Time   PHART 7.366 11/26/2018 1734   HCO3 21.4 11/26/2018 1734   TCO2 23 11/26/2018 1734   ACIDBASEDEF 3.0 (H) 11/26/2018 1734   O2SAT 92.0 11/26/2018 1734   CBG (last 3)  Recent Labs    11/27/18 0108  11/27/18 0201 11/27/18 0412  GLUCAP 98 106* 112*   CXR: ok  ECG: sinus, no acute changes  Assessment/Plan: S/P Procedure(s) (LRB): CORONARY ARTERY BYPASS GRAFTING (CABG) times three (N/A) TRANSESOPHAGEAL ECHOCARDIOGRAM (TEE) (N/A) Endovein Harvest Of Greater Saphenous Vein (Right)  POD 1 Hemodynamically stable in sinus rhythm. Continue Lopressor and titrate up as needed. He was on Coreg preop. LVEF normal.  DM: no hx but preop Hgb A1c was 6.5 on no meds. Continue SSI. Needs diet modification, exercise and weight loss and may be able to stay off meds. If glucose remains high here he can be started on Metformin.  Volume excess: mild. Wt is about at preop if accurate. Will give a dose of lasix this am.  DC chest tubes, swan, arterial line.  IS, ambulate.  Hyperlipidemia: resume Lipitor 80     LOS: 1 day    Alleen Borne 11/27/2018

## 2018-11-27 NOTE — Progress Notes (Signed)
[facility] BRIEF SICU PROGRESS NOTE  1 Day Post-Op  S/P Procedure(s) (LRB): CORONARY ARTERY BYPASS GRAFTING (CABG) times three (N/A) TRANSESOPHAGEAL ECHOCARDIOGRAM (TEE) (N/A) Endovein Harvest Of Greater Saphenous Vein (Right)   Stable day NSR w/ stable BP Breathing comfortably w/ O2 sats 95% on 2 L/min UOP adequate  Plan: Continue current plan  Purcell Nails, MD 11/27/2018 5:10 PM

## 2018-11-27 NOTE — Discharge Instructions (Signed)

## 2018-11-28 ENCOUNTER — Inpatient Hospital Stay (HOSPITAL_COMMUNITY): Payer: 59

## 2018-11-28 DIAGNOSIS — I2510 Atherosclerotic heart disease of native coronary artery without angina pectoris: Secondary | ICD-10-CM

## 2018-11-28 LAB — CBC
HCT: 36.4 % — ABNORMAL LOW (ref 39.0–52.0)
Hemoglobin: 11.5 g/dL — ABNORMAL LOW (ref 13.0–17.0)
MCH: 31.8 pg (ref 26.0–34.0)
MCHC: 31.6 g/dL (ref 30.0–36.0)
MCV: 100.6 fL — ABNORMAL HIGH (ref 80.0–100.0)
Platelets: 255 10*3/uL (ref 150–400)
RBC: 3.62 MIL/uL — ABNORMAL LOW (ref 4.22–5.81)
RDW: 12.6 % (ref 11.5–15.5)
WBC: 14.1 10*3/uL — ABNORMAL HIGH (ref 4.0–10.5)
nRBC: 0 % (ref 0.0–0.2)

## 2018-11-28 LAB — GLUCOSE, CAPILLARY
Glucose-Capillary: 126 mg/dL — ABNORMAL HIGH (ref 70–99)
Glucose-Capillary: 111 mg/dL — ABNORMAL HIGH (ref 70–99)

## 2018-11-28 LAB — BASIC METABOLIC PANEL
Anion gap: 10 (ref 5–15)
BUN: 9 mg/dL (ref 6–20)
CALCIUM: 8.3 mg/dL — AB (ref 8.9–10.3)
CO2: 24 mmol/L (ref 22–32)
Chloride: 102 mmol/L (ref 98–111)
Creatinine, Ser: 0.78 mg/dL (ref 0.61–1.24)
GFR calc Af Amer: >60 mL/min (ref >60)
GFR calc non Af Amer: >60 mL/min (ref >60)
GLUCOSE: 129 mg/dL — AB (ref 70–99)
Potassium: 4.1 mmol/L (ref 3.5–5.1)
Sodium: 136 mmol/L (ref 135–145)

## 2018-11-28 MED ORDER — CARVEDILOL 3.125 MG PO TABS
3.1250 mg | ORAL_TABLET | Freq: Two times a day (BID) | ORAL | Status: DC
  Administered 2018-11-28 – 2018-11-30 (×4): 3.125 mg via ORAL
  Filled 2018-11-28 (×4): qty 1

## 2018-11-28 MED ORDER — MOVING RIGHT ALONG BOOK
Freq: Once | Status: DC
  Filled 2018-11-28: qty 1

## 2018-11-28 MED ORDER — POTASSIUM CHLORIDE CRYS ER 20 MEQ PO TBCR
20.0000 meq | EXTENDED_RELEASE_TABLET | Freq: Every day | ORAL | Status: AC
  Administered 2018-11-28 – 2018-11-30 (×3): 20 meq via ORAL
  Filled 2018-11-28 (×4): qty 1

## 2018-11-28 MED ORDER — FUROSEMIDE 40 MG PO TABS
40.0000 mg | ORAL_TABLET | Freq: Every day | ORAL | Status: AC
  Administered 2018-11-28 – 2018-11-30 (×3): 40 mg via ORAL
  Filled 2018-11-28 (×3): qty 1

## 2018-11-28 NOTE — Progress Notes (Signed)
Report called to Fayrene Fearing, RN on 4E.  Patient transferred to 4E04 via wheelchair accompanied by RN.  Introduced to unit routine and safety issues discussed prior to transfer.  Right neck transducer removed prior to transfer.

## 2018-11-28 NOTE — Progress Notes (Signed)
      [address]       Gabriel Green [address]             [phone]        CARDIOTHORACIC SURGERY PROGRESS NOTE   R2 Days Post-Op Procedure(s) (LRB): CORONARY ARTERY BYPASS GRAFTING (CABG) times three (N/A) TRANSESOPHAGEAL ECHOCARDIOGRAM (TEE) (N/A) Endovein Harvest Of Greater Saphenous Vein (Right)  Subjective: Looks good.  Mild soreness in chest.  Otherwise feels well.  Objective: Vital signs: BP Readings from Last 1 Encounters:  11/28/18 103/67   Pulse Readings from Last 1 Encounters:  11/28/18 84   Resp Readings from Last 1 Encounters:  11/28/18 10   Temp Readings from Last 1 Encounters:  11/28/18 97.6 F (36.4 C) (Oral)    Hemodynamics:    Physical Exam:  Rhythm:   sinus  Breath sounds: clear  Heart sounds:  RRR  Incisions:  Dressings dry, intact  Abdomen:  Soft, non-distended, non-tender  Extremities:  Warm, well-perfused    Intake/Output from previous day: 01/31 0701 - 02/01 0700 In: 440.8 [I.V.:247.6; IV Piggyback:193.2] Out: 1745 [Urine:1725; Chest Tube:20] Intake/Output this shift: No intake/output data recorded.  Lab Results:  CBC: Recent Labs    11/27/18 1647 11/28/18 0532  WBC 17.1* 14.1*  HGB 12.5* 11.5*  HCT 39.2 36.4*  PLT 268 255    BMET:  Recent Labs    11/27/18 1647 11/28/18 0532  NA 138 136  K 4.2 4.1  CL 106 102  CO2 24 24  GLUCOSE 154* 129*  BUN 10 9  CREATININE 0.95 0.78  CALCIUM 8.7* 8.3*     PT/INR:   Recent Labs    11/26/18 1316  LABPROT 17.3*  INR 1.43    CBG (last 3)  Recent Labs    11/27/18 2011 11/27/18 2321 11/28/18 0319  GLUCAP 134* 117* 126*    ABG    Component Value Date/Time   PHART 7.366 11/26/2018 1734   PCO2ART 37.7 11/26/2018 1734   PO2ART 68.0 (L) 11/26/2018 1734   HCO3 21.4 11/26/2018 1734   TCO2 23 11/26/2018 1734   ACIDBASEDEF 3.0 (H) 11/26/2018 1734   O2SAT 92.0 11/26/2018 1734    CXR: PORTABLE CHEST 1 VIEW  COMPARISON:  Radiograph of November 27, 2018.  FINDINGS: Stable cardiomegaly. Sternotomy wires are noted. Swan-Ganz catheter has been removed. Right internal jugular venous sheath remains. No pneumothorax is noted. Left-sided chest tube has been removed. Minimal bibasilar subsegmental atelectasis is noted. Bony thorax is unremarkable.  IMPRESSION: No pneumothorax status post left-sided chest tube removal. Minimal bibasilar subsegmental atelectasis.   Electronically Signed   By: Lupita Raider, M.D.   On: 11/28/2018 07:40   Assessment/Plan: S/P Procedure(s) (LRB): CORONARY ARTERY BYPASS GRAFTING (CABG) times three (N/A) TRANSESOPHAGEAL ECHOCARDIOGRAM (TEE) (N/A) Endovein Harvest Of Greater Saphenous Vein (Right)  Overall doing well POD2 Maintaining NSR with stable BP  Breathing comfortably w/ O2 sats 96% on 4 L/min, CXR looks good Expected post op acute blood loss anemia, mild Expected post op atelectasis, mild Expected post op volume excess, weight reportedly close to preop, UOP adquate   Mobilize  Diuresis  Transfer 4E  Routine postop    Purcell Nails, MD 11/28/2018 10:42 AM

## 2018-11-29 LAB — BASIC METABOLIC PANEL
Anion gap: 12 (ref 5–15)
BUN: 11 mg/dL (ref 6–20)
CO2: 22 mmol/L (ref 22–32)
Calcium: 8.6 mg/dL — ABNORMAL LOW (ref 8.9–10.3)
Chloride: 102 mmol/L (ref 98–111)
Creatinine, Ser: 0.81 mg/dL (ref 0.61–1.24)
GFR calc non Af Amer: >60 mL/min (ref >60)
Glucose, Bld: 128 mg/dL — ABNORMAL HIGH (ref 70–99)
Potassium: 3.8 mmol/L (ref 3.5–5.1)
SODIUM: 136 mmol/L (ref 135–145)

## 2018-11-29 LAB — CBC
HCT: 36.4 % — ABNORMAL LOW (ref 39.0–52.0)
Hemoglobin: 11.6 g/dL — ABNORMAL LOW (ref 13.0–17.0)
MCH: 31.3 pg (ref 26.0–34.0)
MCHC: 31.9 g/dL (ref 30.0–36.0)
MCV: 98.1 fL (ref 80.0–100.0)
Platelets: 265 10*3/uL (ref 150–400)
RBC: 3.71 MIL/uL — ABNORMAL LOW (ref 4.22–5.81)
RDW: 12.4 % (ref 11.5–15.5)
WBC: 12.8 10*3/uL — ABNORMAL HIGH (ref 4.0–10.5)
nRBC: 0 % (ref 0.0–0.2)

## 2018-11-29 NOTE — Progress Notes (Addendum)
3 Days Post-Op Procedure(s) (LRB): CORONARY ARTERY BYPASS GRAFTING (CABG) times three (N/A) TRANSESOPHAGEAL ECHOCARDIOGRAM (TEE) (N/A) Endovein Harvest Of Greater Saphenous Vein (Right) Subjective: Awake and alert, sitting in the bedside chair with his wife's side.  States he feels well.  He is making progress with his mobility.  He has not used supplemental oxygen since transferring to this unit yesterday.  Bowel movement yesterday.  Objective: Vital signs in last 24 hours: Temp:  [97.8 F (36.6 C)-98.6 F (37 C)] 98.4 F (36.9 C) (02/02 0232) Pulse Rate:  [82-89] 84 (02/02 0232) Cardiac Rhythm: Normal sinus rhythm (02/02 0700) Resp:  [13-24] 13 (02/02 0232) BP: (102-126)/(61-78) 125/77 (02/02 0232) SpO2:  [92 %-95 %] 93 % (02/02 0232) Weight:  [111.4 kg] 111.4 kg (02/02 0232)    Intake/Output from previous day: 02/01 0701 - 02/02 0700 In: 840 [P.O.:840] Out: -  Intake/Output this shift: No intake/output data recorded.  General appearance: alert, cooperative and no distress Heart: regular rate and rhythm Lungs: clear to auscultation bilaterally Wound: The sternotomy incision, chest tube insertion sites, and right lower extremity EVH incisions are all intact and healing with no sign of complication.  Lab Results: Recent Labs    11/28/18 0532 11/29/18 0218  WBC 14.1* 12.8*  HGB 11.5* 11.6*  HCT 36.4* 36.4*  PLT 255 265   BMET:  Recent Labs    11/28/18 0532 11/29/18 0218  NA 136 136  K 4.1 3.8  CL 102 102  CO2 24 22  GLUCOSE 129* 128*  BUN 9 11  CREATININE 0.78 0.81  CALCIUM 8.3* 8.6*    PT/INR:  Recent Labs    11/26/18 1316  LABPROT 17.3*  INR 1.43   ABG    Component Value Date/Time   PHART 7.366 11/26/2018 1734   HCO3 21.4 11/26/2018 1734   TCO2 23 11/26/2018 1734   ACIDBASEDEF 3.0 (H) 11/26/2018 1734   O2SAT 92.0 11/26/2018 1734   CBG (last 3)  Recent Labs    11/27/18 2321 11/28/18 0319 11/28/18 0812  GLUCAP 117* 126* 111*     Assessment/Plan: S/P Procedure(s) (LRB): CORONARY ARTERY BYPASS GRAFTING (CABG) times three (N/A) TRANSESOPHAGEAL ECHOCARDIOGRAM (TEE) (N/A) Endovein Harvest Of Greater Saphenous Vein (Right)   1. POD3 CABGx4 for MVCAD and preserved LV function.  Making satisfactory progress with mobility and pulmonary hygiene.  No significant arrhythmias.  We will plan to remove epicardial pacing wires today in anticipation of discharge tomorrow. 2.  Dyslipidemia-statin resumed 3.  Mild leukocytosis-resolving 4.  Expected postop volume excess, diuresing with oral Lasix    LOS: 3 days    Leary Roca, PA-C [phone] 11/29/2018  I have seen and examined the patient and agree with the assessment and plan as outlined.  Purcell Nails, MD 11/29/2018 10:57 AM

## 2018-11-29 NOTE — Progress Notes (Signed)
Pt ambulated 470 feet around unit pt tolerated well 

## 2018-11-29 NOTE — Progress Notes (Signed)
Pt ambulated hall independently with wife.

## 2018-11-29 NOTE — Progress Notes (Signed)
Pt ambulated hall approx 450 ft, no complaints no tele alarms.  To recliner after walk, will con't plan of care.

## 2018-11-29 NOTE — Progress Notes (Signed)
EPWs pulled per order.  Pt tolerated extremely well.  All tips intact, sites unremarkable.  Pt understands bedrest for 1 hr with frequent VS.  CCMD notified, will monitor closely.

## 2018-11-30 DIAGNOSIS — Z951 Presence of aortocoronary bypass graft: Secondary | ICD-10-CM

## 2018-11-30 LAB — CBC
HCT: 35.4 % — ABNORMAL LOW (ref 39.0–52.0)
Hemoglobin: 11.4 g/dL — ABNORMAL LOW (ref 13.0–17.0)
MCH: 31.3 pg (ref 26.0–34.0)
MCHC: 32.2 g/dL (ref 30.0–36.0)
MCV: 97.3 fL (ref 80.0–100.0)
Platelets: 295 10*3/uL (ref 150–400)
RBC: 3.64 MIL/uL — ABNORMAL LOW (ref 4.22–5.81)
RDW: 12.5 % (ref 11.5–15.5)
WBC: 12.4 10*3/uL — ABNORMAL HIGH (ref 4.0–10.5)
nRBC: 0 % (ref 0.0–0.2)

## 2018-11-30 LAB — BASIC METABOLIC PANEL
Anion gap: 10 (ref 5–15)
BUN: 12 mg/dL (ref 6–20)
CO2: 22 mmol/L (ref 22–32)
Calcium: 8.7 mg/dL — ABNORMAL LOW (ref 8.9–10.3)
Chloride: 108 mmol/L (ref 98–111)
Creatinine, Ser: 0.83 mg/dL (ref 0.61–1.24)
GFR calc Af Amer: >60 mL/min (ref >60)
GFR calc non Af Amer: >60 mL/min (ref >60)
Glucose, Bld: 131 mg/dL — ABNORMAL HIGH (ref 70–99)
Potassium: 3.9 mmol/L (ref 3.5–5.1)
Sodium: 140 mmol/L (ref 135–145)

## 2018-11-30 MED ORDER — FUROSEMIDE 40 MG PO TABS: 40.0000 mg | ORAL_TABLET | Freq: Every day | ORAL | 0 refills | Status: DC

## 2018-11-30 MED ORDER — OXYCODONE HCL 5 MG PO TABS: 5.0000 mg | ORAL_TABLET | ORAL | 0 refills | Status: DC | PRN

## 2018-11-30 MED ORDER — POTASSIUM CHLORIDE CRYS ER 20 MEQ PO TBCR: 20.0000 meq | EXTENDED_RELEASE_TABLET | Freq: Every day | ORAL | 0 refills | Status: DC

## 2018-11-30 MED ORDER — ACETAMINOPHEN 500 MG PO TABS: 1000.0000 mg | ORAL_TABLET | Freq: Four times a day (QID) | ORAL | 0 refills | Status: DC | PRN

## 2018-11-30 NOTE — Progress Notes (Signed)
Patient in a stable condition , discharge education reviewed with patient and his wife at bedside, they verbalised understanding, iv removed, tele dc ccmd notified, patient belongings at bedside, patient to be transported home by his wife

## 2018-11-30 NOTE — Progress Notes (Signed)
CARDIAC REHAB PHASE I   Pt ambulating in hallway independently with wife. HR 90 SR. Pt c/o feeling slightly SOB at end of walk. D/c education completed with pt and wife. Reviewed importance of shower and monitoring incisions daily. Encouraged continued IS use, walks and sternal precautions. Pt given in-the-tube sheet, and heart healthy diet. Reviewed restrictions and exercise guidelines. Discussed smoking cessation with pt, pt states he will try to gradually cut back, but cannot commit to complete cessation. Will refer to CRP II [place].  [id number] Reynold Bowen, RN BSN 11/30/2018 10:19 AM

## 2018-11-30 NOTE — Care Management Note (Signed)
Case Management Note Donn Pierini RN, BSN Transitions of Care Unit 4E- RN Case Manager [phone]  Patient Details  Name: Gabriel Green MRN: [id number] Date of Birth: [birth year]  Subjective/Objective:   Pt admitted s/p CABG                 Action/Plan: PTA pt lived at home, per RN pt would like RW for home, order has been placed, call made to [facility] with [facility] for DME need, RW to be delivered to room prior to discharge. No other CM needs noted for transition home.  Expected Discharge Date:  11/30/18               Expected Discharge Plan:  Home/Self Care  In-House Referral:  NA  Discharge planning Services  CM Consult  Post Acute Care Choice:  Durable Medical Equipment Choice offered to:  Patient  DME Arranged:  Dan Humphreys rolling DME Agency:  [facility]  HH Arranged:  NA HH Agency:  NA  Status of Service:  Completed, signed off  If discussed at Long Length of Stay Meetings, dates discussed:    Discharge Disposition: home/self care   Additional Comments:  Darrold Span, RN 11/30/2018, 10:17 AM

## 2018-11-30 NOTE — Progress Notes (Addendum)
      [address]       [place] [address]             [phone]      4 Days Post-Op Procedure(s) (LRB): CORONARY ARTERY BYPASS GRAFTING (CABG) times three (N/A) TRANSESOPHAGEAL ECHOCARDIOGRAM (TEE) (N/A) Endovein Harvest Of Greater Saphenous Vein (Right)   Subjective:  No new complaints.  He feels good and is ready to go home.  + ambulation  + BM  Objective: Vital signs in last 24 hours: Temp:  [98.1 F (36.7 C)-98.4 F (36.9 C)] 98.1 F (36.7 C) (02/03 0500) Pulse Rate:  [75-89] 75 (02/03 0500) Cardiac Rhythm: Normal sinus rhythm (02/03 0700) Resp:  [11-22] 18 (02/03 0500) BP: (103-119)/(65-79) 112/72 (02/03 0500) SpO2:  [95 %] 95 % (02/03 0500) Weight:  [111.2 kg] 111.2 kg (02/03 0500)  Intake/Output from previous day: 02/02 0701 - 02/03 0700 In: 520 [P.O.:520] Out: -   General appearance: alert, cooperative and no distress Heart: regular rate and rhythm Lungs: clear to auscultation bilaterally Abdomen: soft, non-tender; bowel sounds normal; no masses,  no organomegaly Extremities: edema trace Wound: clean and dry  Lab Results: Recent Labs    11/29/18 0218 11/30/18 0301  WBC 12.8* 12.4*  HGB 11.6* 11.4*  HCT 36.4* 35.4*  PLT 265 295   BMET:  Recent Labs    11/29/18 0218 11/30/18 0301  NA 136 140  K 3.8 3.9  CL 102 108  CO2 22 22  GLUCOSE 128* 131*  BUN 11 12  CREATININE 0.81 0.83  CALCIUM 8.6* 8.7*    PT/INR: No results for input(s): LABPROT, INR in the last 72 hours. ABG    Component Value Date/Time   PHART 7.366 11/26/2018 1734   HCO3 21.4 11/26/2018 1734   TCO2 23 11/26/2018 1734   ACIDBASEDEF 3.0 (H) 11/26/2018 1734   O2SAT 92.0 11/26/2018 1734   CBG (last 3)  Recent Labs    11/27/18 2321 11/28/18 0319 11/28/18 0812  GLUCAP 117* 126* 111*    Assessment/Plan: S/P Procedure(s) (LRB): CORONARY ARTERY BYPASS GRAFTING (CABG) times three (N/A) TRANSESOPHAGEAL ECHOCARDIOGRAM (TEE) (N/A) Endovein Harvest Of  Greater Saphenous Vein (Right)  1. CV- NSR, BP controlled, continue Coreg 2. Pulm- no acute issues, continue IS 3. Renal- creatinine WNL, weight is trending down, continue Lasix for a few more days 4. Dispo- patient stable, will d/c home today   LOS: 4 days    Gabriel Green 11/30/2018   Chart reviewed, patient examined, agree with above. He is doing well Preop Hgb A1c was 6.5 and he was eating whatever he wanted. I discussed this with him and he understands that he has DM and needs to change diet, exercise and loose wt. He says that he has been told that before but is now motivated to make changes including smoking cessation. Plan home today.

## 2018-11-30 NOTE — Progress Notes (Signed)
Progress Note  Patient Name: Gabriel Green Date of Encounter: 11/30/2018  Primary Cardiologist: Donato Schultz, MD   Subjective   Feels well. No concerns.  Inpatient Medications    Scheduled Meds: . acetaminophen  1,000 mg Oral Q6H  . aspirin EC  325 mg Oral Daily  . atorvastatin  80 mg Oral q1800  . bisacodyl  10 mg Oral Daily   Or  . bisacodyl  10 mg Rectal Daily  . carvedilol  3.125 mg Oral BID WC  . docusate sodium  200 mg Oral Daily  . furosemide  40 mg Oral Daily  . mouth rinse  15 mL Mouth Rinse BID  . moving right along book   Does not apply Once  . pantoprazole  40 mg Oral Daily  . potassium chloride  20 mEq Oral Daily  . sodium chloride flush  3 mL Intravenous Q12H   Continuous Infusions:  PRN Meds: metoprolol tartrate, morphine injection, ondansetron (ZOFRAN) IV, oxyCODONE, sodium chloride flush, sodium chloride flush, traMADol   Vital Signs    Vitals:   11/29/18 2026 11/29/18 2343 11/30/18 0500 11/30/18 0758  BP: 119/65 116/74 112/72 113/71  Pulse: 89 83 75 74  Resp: 11 20 18 18   Temp: 98.4 F (36.9 C) 98.3 F (36.8 C) 98.1 F (36.7 C) 98.4 F (36.9 C)  TempSrc: Oral Oral Oral Oral  SpO2: 95% 95% 95% 94%  Weight:   111.2 kg   Height:        Intake/Output Summary (Last 24 hours) at 11/30/2018 0833 Last data filed at 11/30/2018 0700 Gross per 24 hour  Intake 640 ml  Output -  Net 640 ml   Filed Weights   11/28/18 0500 11/29/18 0232 11/30/18 0500  Weight: 111.6 kg 111.4 kg 111.2 kg    Telemetry    NSR - Personally Reviewed  ECG    No new  Physical Exam   GEN: No acute distress.   Neck: No JVD Cardiac: regular rhythm, normal rate, no murmurs, rubs, or gallops.  Chest: sternotomy healing well. Respiratory: Clear to auscultation bilaterally. GI: Soft, nontender, non-distended  MS: Trace bilateral edema; No deformity. Neuro:  Nonfocal  Psych: Normal affect   Labs    Chemistry Recent Labs  Lab 11/28/18 0532 11/29/18 0218  11/30/18 0301  NA 136 136 140  K 4.1 3.8 3.9  CL 102 102 108  CO2 24 22 22   GLUCOSE 129* 128* 131*  BUN 9 11 12   CREATININE 0.78 0.81 0.83  CALCIUM 8.3* 8.6* 8.7*  GFRNONAA >60 >60 >60  GFRAA >60 >60 >60  ANIONGAP 10 12 10      Hematology Recent Labs  Lab 11/28/18 0532 11/29/18 0218 11/30/18 0301  WBC 14.1* 12.8* 12.4*  RBC 3.62* 3.71* 3.64*  HGB 11.5* 11.6* 11.4*  HCT 36.4* 36.4* 35.4*  MCV 100.6* 98.1 97.3  MCH 31.8 31.3 31.3  MCHC 31.6 31.9 32.2  RDW 12.6 12.4 12.5  PLT 255 265 295    Cardiac EnzymesNo results for input(s): TROPONINI in the last 168 hours. No results for input(s): TROPIPOC in the last 168 hours.   BNPNo results for input(s): BNP, PROBNP in the last 168 hours.   DDimer No results for input(s): DDIMER in the last 168 hours.   Radiology    No results found.   Assessment & Plan   Active Problems:   S/P CABG x 4   CARDIOLOGY RECOMMENDATIONS:  Discharge is anticipated in the next 48 hours. Recommendations for medications and  follow up:  Discharge Medications: - ASA 325 mg until TCTS follow up, then transition to ASA 81 mg daily. -Atorvastatin 80 mg daily - carvedilol 3.125 mg twice daily - furosemide 40 mg daily - potassium chloride 20 meq Continue medications as they are currently listed in the [facility]. Exceptions to the above:  none  Follow Up: The patient's Primary Cardiologist is Donato Schultz, MD   Follow up in the office in 2 week(s) with Nada Boozer NP.   Signed,  Parke Poisson, MD  8:33 AM 11/30/2018  [facility]   For questions or updates, please contact [facility] Please consult [email or web address] for contact info under        Signed, Parke Poisson, MD  11/30/2018, 8:33 AM

## 2018-12-03 ENCOUNTER — Telehealth (HOSPITAL_COMMUNITY): Payer: Self-pay

## 2018-12-03 NOTE — Telephone Encounter (Signed)
Attempted to call patient in regards to Cardiac Rehab - LM on VM 

## 2018-12-03 NOTE — Telephone Encounter (Signed)
Pt insurance is active and benefits verified through [facility]. Co-pay $0.00, DED $3,500.00/$3,500.00 met, out of pocket $6,350.00/$5,844.20 met, co-insurance 20%. No pre-authorization required. Passport, 12/03/2018 @ 9:01AM, REF# [id number]  Will contact patient to see if he is interested in the Cardiac Rehab Program. If interested, patient will need to complete follow up appt. Once completed, patient will be contacted for scheduling upon review by the RN Navigator.

## 2018-12-04 MED FILL — Mannitol IV Soln 20%: INTRAVENOUS | Qty: 500 | Status: AC

## 2018-12-04 MED FILL — Sodium Bicarbonate IV Soln 8.4%: INTRAVENOUS | Qty: 50 | Status: AC

## 2018-12-04 MED FILL — Sodium Chloride IV Soln 0.9%: INTRAVENOUS | Qty: 2000 | Status: AC

## 2018-12-04 MED FILL — Electrolyte-R (PH 7.4) Solution: INTRAVENOUS | Qty: 3000 | Status: AC

## 2018-12-04 MED FILL — Lidocaine HCl(Cardiac) IV PF Soln Pref Syr 100 MG/5ML (2%): INTRAVENOUS | Qty: 5 | Status: AC

## 2018-12-07 ENCOUNTER — Encounter (INDEPENDENT_AMBULATORY_CARE_PROVIDER_SITE_OTHER): Payer: Self-pay

## 2018-12-07 DIAGNOSIS — Z4802 Encounter for removal of sutures: Secondary | ICD-10-CM

## 2018-12-14 ENCOUNTER — Encounter: Payer: Self-pay | Admitting: Cardiology

## 2018-12-14 NOTE — Progress Notes (Signed)
Cardiology Office Note   Date:  12/15/2018   ID:  Gabriel Green, DOB 1961/07/20, MRN [id number]  PCP:  Patient, No Pcp Per  Cardiologist:  Gabriel Green     Chief Complaint  Patient presents with  . Hospitalization Follow-up    CABG      History of Present Illness: Gabriel Green is a 58 y.o. male who presents for post hospitlization  He has a h/o HTN, tob abuse, etoh abuse, and obesity. He has no prior cardiac history. He was in his usoh until about 2 mos ago, when he began to experience intermittent rest and exertional sscp, frequently occurring @ night with lying down, radiating to the left forearm, assoc w/ dyspnea, lasting a few mins, and resolving with rest or sitting up in bed. He was recently traveling for work in [place], and developed recurrent chest discomfort, prompting him to present to a local ED on 1/13where he was found to have an elevated HS troponin, eventually peaking @ 1797. Echo showed nl EF. Cath was performed 1/15 and showed severe multivessel CAD involving the LM/LAD/LCX/RCA. There was dampening of the waveform with engagement of the LM with catheter. He was advised that he would require CABG and was seen by CT surgery with a plan for CABG yesterday. He had several things to take care of however - work/family - and thus, he opted to leave AMA and fly home via private jet. He had some chest tightness when walking in the airport but no sustained symptoms. Upon returning to [facility], he presented to the ED for eval and admission after speaking with Dr. Lia Foyer  He was evaluated and found stable for discharge.   Saw Dr. Cyndia Bent as out pt  CABG 1/30.20 Coronary artery bypass grafting x 4   Left internal mammary graft to the LAD  SVG to diagonal  SVG to OM1  SVG to PDA 4.   Endoscopic vein harvest from the right leg  D/c'd 11/30/18  Today he is doing well, minimal pain.  No fevers, appetite is good.  He does want to continue to lose wt.  He is walking mostly  in his home and does plan to attend cardiac rehab.  He is still smoking and his plan is to decrease to 10 then decrease every few days afterwards. Currently he is focusing on diet.  We discussed his elevated Hgb A1C of 6.5.  His wife is pre-diabetic so he is eating her diet.     Past Medical History:  Diagnosis Date  . Alcohol use   . Borderline diabetes    a. 10/2018 HbA1c = 6.3.  . CAD (coronary artery disease)    a. 11/10/2018 NSTEMI/Cath ([place]): LM 70ost (dampening), LAD 70-80p, D1 20-30, LCX 70-80p, RCA 39mw/ L->R collats, RPDA/RPL min irregs.  .Marland KitchenDyspnea    with walking  . History of echocardiogram    a. 11/10/2017 Echo: Nl LV fxn. Triv MR/TR.  .Marland KitchenHistory of kidney stones   . Hypertension   . Morbid obesity ([place]   . Tobacco abuse     Past Surgical History:  Procedure Laterality Date  . CORONARY ARTERY BYPASS GRAFT N/A 11/26/2018   Procedure: CORONARY ARTERY BYPASS GRAFTING (CABG) times three;  Surgeon: BGaye Pollack MD;  [facility];  Service: Open Heart Surgery;  Laterality: N/A;  . ENDOVEIN HARVEST OF GREATER SAPHENOUS VEIN Right 11/26/2018   Procedure: ECharleston RopesOf Greater Saphenous Vein;  Surgeon: BGaye Pollack MD;  Location:  [place] [facility];  Service: Open Heart Surgery;  Laterality: Right;  . TEE WITHOUT CARDIOVERSION N/A 11/26/2018   Procedure: TRANSESOPHAGEAL ECHOCARDIOGRAM (TEE);  Surgeon: Gaye Pollack, MD;  Location: [place];  Service: Open Heart Surgery;  Laterality: N/A;  . TONSILLECTOMY       Current Outpatient Medications  Medication Sig Dispense Refill  . aspirin EC 81 MG EC tablet Take 1 tablet (81 mg total) by mouth daily.    Marland Kitchen atorvastatin (LIPITOR) 80 MG tablet Take 1 tablet (80 mg total) by mouth daily at 6 PM. 90 tablet 3  . carvedilol (COREG) 3.125 MG tablet Take 1 tablet (3.125 mg total) by mouth 2 (two) times daily with a meal. 60 tablet 11  . nitroGLYCERIN (NITROSTAT) 0.4 MG SL tablet Place 0.4 mg under the tongue as needed.      No current facility-administered medications for this visit.     Allergies:   Patient has no known allergies.    Social History:  The patient  reports that he has been smoking cigarettes. He has a 40.00 pack-year smoking history. He has never used smokeless tobacco. He reports current alcohol use. He reports that he does not use drugs.   Family History:  The patient's family history includes Alzheimer's disease in his mother; High blood pressure in his father; Other in his brother and brother; Thyroid disease in his sister.    ROS:  General:no colds or fevers, no weight changes Skin:no rashes or ulcers HEENT:no blurred vision, no congestion CV:see HPI PUL:see HPI GI:no diarrhea constipation or melena, no indigestion GU:no hematuria, no dysuria MS:no joint pain, no claudication Neuro:no syncope, no lightheadedness Endo:botrderline diabetes, no thyroid disease  Wt Readings from Last 3 Encounters:  12/15/18 240 lb 3.2 oz (109 kg)  11/30/18 245 lb 2.4 oz (111.2 kg)  11/17/18 246 lb 6.4 oz (111.8 kg)     PHYSICAL EXAM: VS:  BP 118/66   Pulse 76   Ht '5\' 9"'  (1.753 m)   Wt 240 lb 3.2 oz (109 kg)   SpO2 97%   BMI 35.47 kg/m  , BMI Body mass index is 35.47 kg/m. General:Pleasant affect, NAD Skin:Warm and dry, brisk capillary refill HEENT:normocephalic, sclera clear, mucus membranes moist Neck:supple, no JVD, no bruits  Heart:S1S2 RRR without murmur, gallup, rub or click Lungs:clear without rales, rhonchi, or wheezes ZOX:WRUE, non tender, + BS, do not palpate liver spleen or masses Ext:no lower ext edema, 2+ pedal pulses, 2+ radial pulses Neuro:alert and oriented X 3, MAE, follows commands, + facial symmetry    EKG:  EKG is ordered today. The ekg ordered today demonstrates SR inf infarct no acute changes post CABG   Recent Labs: 11/17/2018: ALT 29 11/27/2018: Magnesium 2.3 11/30/2018: BUN 12; Creatinine, Ser 0.83; Hemoglobin 11.4; Platelets 295; Potassium 3.9; Sodium  140    Lipid Panel No results found for: CHOL, TRIG, HDL, CHOLHDL, VLDL, LDLCALC, LDLDIRECT     Other studies Reviewed: Additional studies/ records that were reviewed today include: . Cardiac Cath  CORONARY ANGIOGRAPHY: Dominance: Right Left Main: The left main is a large caliber vessel that bifurcates into  the LAD and left circumflex arteries, there is a 70% stenotic lesion at  the ostium with significant drop in blood pressure when the vessel was  engaged with the catheter, TIMI-3 flow LAD: The LAD is a large caliber vessel that wraps around to the apex,  there is a 70 to 80% stenotic lesion in the proximal segment, the  remainder of the  vessel has mild disease, TIMI-3 flow. There are left to  right collaterals present. Diagonals: The first diagonal is a large caliber vessel that has 20 to 30%  stenosis LCx: Left circumflex is a large caliber vessel that has 70 to 80% stenosis  in the proximal segment, the remainder of the vessel has mild disease,  TIMI-3 flow Obtuse marginals: The first OM is a large caliber vessel with no  significant disease, the second and third OM are small-caliber vessels  with mild disease RCA: The RCA is a large caliber vessel, there is mild disease in the  proximal segment, there is diffuse disease in the midsegment with a 90%  stenotic lesion, the distal vessel has mild disease, TIMI-3 flow. There  are left to right collaterals present PDA/PLA: These are moderate caliber vessels with mild luminal  irregularities     Procedure: Patient was bought to the cardiac catheterization lab after  obtaining informed consent. B/L groins were prepped and draped in sterile  manner. Arterial Access was obtained via R common femoral A. Using  modified Seldinger technique using micropuncture kit and ultrasound  guidance. A 6 French sheath was inserted into the right femoral artery.  A JL4 catheter was inserted through the sheath and advanced into the a    sending aorta where was used to engage the left main coronary artery.  Selective left coronary angiography was performed in multiple views.  Next, the JL4 catheter was exchanged for a JR4 catheter which was used to  engage the right coronary artery selectively. Right coronary angiography  was performed in multiple views. Next, a pigtail catheter was inserted  and used to cross the aortic valve into the left ventricle where pressures  were recorded. We then performed a limited iliofemoral angiography on the  right to determine if device closure was a possibility. A 6 French  Angio-Seal device was deployed in the right femoral artery to achieve  patent hemostasis. The patient tolerated the procedure well without any  complications. He left the Cath Lab in stable condition return to post  cardiac cath recovery for close monitoring.    CABG 11/26/18 PROCEDURE:  Procedure(s): CORONARY ARTERY BYPASS GRAFTING (CABG) (N/A) TRANSESOPHAGEAL ECHOCARDIOGRAM (TEE) (N/A) Endovein Harvest Of Greater Saphenous Vein (Right)  LIMA->LAD SVG->D1 SVG->RI SVG->PDA  ASSESSMENT AND PLAN:  1.  CAD with CABG X 4 - doing well, progressively nicely.  Continue BB, Statin, ASA, no longer an lasix and doing well.  No edema  Follow up with Gabriel Green in 6-8 weeks.  Cardiac rehab per Dr. Cyndia Bent.  Check BMP after lasix.  2.  HLD on lipitor 80 will recheck lipids in 6 weeks or so.  3.  Anemia recheck CBC  4.  Borderline diabetic - is following his wife's diabetic diet.  Plan for wt loss as well.  5.  Tobacco use, cutting back with plan to stop.        Current medicines are reviewed with the patient today.  The patient Has nconcerns regarding medicines.  The following changes have been made:  See above Labs/ tests ordered today include:see above  Disposition:   FU:  see above  Signed, Cecilie Kicks, NP  12/15/2018 11:13 PM    [facility] [address] [place], [place], [place] Phone: [phone]; Fax: [phone]

## 2018-12-15 ENCOUNTER — Ambulatory Visit (INDEPENDENT_AMBULATORY_CARE_PROVIDER_SITE_OTHER): Payer: 59 | Admitting: Cardiology

## 2018-12-15 ENCOUNTER — Encounter: Payer: Self-pay | Admitting: Cardiology

## 2018-12-15 ENCOUNTER — Other Ambulatory Visit: Payer: Self-pay | Admitting: *Deleted

## 2018-12-15 VITALS — BP 118/66 | HR 76 | Ht 69.0 in | Wt 240.2 lb

## 2018-12-15 DIAGNOSIS — Z951 Presence of aortocoronary bypass graft: Secondary | ICD-10-CM | POA: Diagnosis not present

## 2018-12-15 DIAGNOSIS — E785 Hyperlipidemia, unspecified: Secondary | ICD-10-CM | POA: Diagnosis not present

## 2018-12-15 DIAGNOSIS — Z720 Tobacco use: Secondary | ICD-10-CM

## 2018-12-15 DIAGNOSIS — R7303 Prediabetes: Secondary | ICD-10-CM

## 2018-12-15 DIAGNOSIS — I2510 Atherosclerotic heart disease of native coronary artery without angina pectoris: Secondary | ICD-10-CM

## 2018-12-15 DIAGNOSIS — D649 Anemia, unspecified: Secondary | ICD-10-CM

## 2018-12-15 NOTE — Patient Instructions (Signed)
Medication Instructions:  Your physician recommends that you continue on your current medications as directed. Please refer to the Current Medication list given to you today.  If you need a refill on your cardiac medications before your next appointment, please call your pharmacy.   Lab work: TODAY: BMET & CBC  Your physician recommends that you return for a FASTING lipid profile and Hepatic function test in 6 weeks  If you have labs (blood work) drawn today and your tests are completely normal, you will receive your results only by: Marland Kitchen MyChart Message (if you have MyChart) OR . A paper copy in the mail If you have any lab test that is abnormal or we need to change your treatment, we will call you to review the results.  Testing/Procedures: None   Follow-Up: At [facility], you and your health needs are our priority.  As part of our continuing mission to provide you with exceptional heart care, we have created designated Provider Care Teams.  These Care Teams include your primary Cardiologist (physician) and Advanced Practice Providers (APPs -  Physician Assistants and Nurse Practitioners) who all work together to provide you with the care you need, when you need it. You will need a follow up appointment in 6-8 weeks. You may see Donato Schultz, MD or one of the following Advanced Practice Providers on your designated Care Team:   Norma Fredrickson, NP Nada Boozer, NP . Georgie Chard, NP  Any Other Special Instructions Will Be Listed Below (If Applicable).

## 2018-12-16 ENCOUNTER — Telehealth: Payer: Self-pay | Admitting: *Deleted

## 2018-12-16 LAB — CBC
Hematocrit: 42.6 % (ref 37.5–51.0)
Hemoglobin: 13.6 g/dL (ref 13.0–17.7)
MCH: 31.3 pg (ref 26.6–33.0)
MCHC: 31.9 g/dL (ref 31.5–35.7)
MCV: 98 fL — AB (ref 79–97)
Platelets: 491 10*3/uL — ABNORMAL HIGH (ref 150–450)
RBC: 4.34 x10E6/uL (ref 4.14–5.80)
RDW: 12.4 % (ref 11.6–15.4)
WBC: 11.5 10*3/uL — ABNORMAL HIGH (ref 3.4–10.8)

## 2018-12-16 LAB — BASIC METABOLIC PANEL
BUN/Creatinine Ratio: 9 (ref 9–20)
BUN: 8 mg/dL (ref 6–24)
CO2: 25 mmol/L (ref 20–29)
CREATININE: 0.93 mg/dL (ref 0.76–1.27)
Calcium: 9.4 mg/dL (ref 8.7–10.2)
Chloride: 101 mmol/L (ref 96–106)
GFR calc Af Amer: 106 mL/min/{1.73_m2} (ref >59)
GFR calc non Af Amer: 92 mL/min/{1.73_m2} (ref >59)
Glucose: 87 mg/dL (ref 65–99)
Potassium: 4.8 mmol/L (ref 3.5–5.2)
Sodium: 139 mmol/L (ref 134–144)

## 2018-12-16 NOTE — Addendum Note (Signed)
Addended by: Vernard Gambles on: 12/16/2018 05:27 PM   Modules accepted: Orders

## 2018-12-16 NOTE — Telephone Encounter (Signed)
-----   Message from Sharin Grave, RN sent at 12/16/2018  8:07 AM EST ----- Elvina Sidle,  We can put him on 4/16, 4/17 or 4/23 at 11:40 AM if one of those work for him. Thank you  ----- Message ----- From: Elliot Cousin, RMA Sent: 12/16/2018   7:30 AM EST To: Sharin Grave, RN  Pam,See Laura's message below. I looked and in 6-8 weeks I only see a 7 day hold and I don't schedule without asking 1st. Let me know where I can plug him in at and I will be glad to call him. Thanks! ----- Message ----- From: Leone Brand, NP Sent: 12/15/2018  11:25 PM EST To: Elliot Cousin, RMA  Please have pt see Dr. Anne Fu in 6-8 weeks and not me.  Must be with MD.  Thanks.

## 2018-12-16 NOTE — Telephone Encounter (Signed)
Left pt a detailed message that we have changed his appt time on 02/11/2019 to 11:40 that he will be seeing Dr. Anne Fu instead of Nada Boozer, NP. Asked pt to call back just to clarify he received the message.

## 2018-12-17 ENCOUNTER — Telehealth: Payer: Self-pay | Admitting: *Deleted

## 2018-12-17 NOTE — Telephone Encounter (Signed)
Called pt re: lab results, left a message for pt to call back.  

## 2018-12-18 NOTE — Telephone Encounter (Signed)
2nd attempt to reach pt.  Left another message for pt to call back. 

## 2018-12-22 NOTE — Telephone Encounter (Signed)
Attempted to call patient in regards to Cardiac Rehab - LM on VM 

## 2018-12-25 ENCOUNTER — Encounter: Payer: Self-pay | Admitting: *Deleted

## 2018-12-25 NOTE — Telephone Encounter (Signed)
3rd attempt to reach pt re: lab results. Will mail letter and have pt call back.

## 2018-12-29 ENCOUNTER — Other Ambulatory Visit: Payer: Self-pay | Admitting: Surgery

## 2018-12-29 DIAGNOSIS — Z951 Presence of aortocoronary bypass graft: Secondary | ICD-10-CM

## 2018-12-30 ENCOUNTER — Other Ambulatory Visit: Payer: Self-pay

## 2018-12-30 ENCOUNTER — Ambulatory Visit
Admission: RE | Admit: 2018-12-30 | Discharge: 2018-12-30 | Disposition: A | Discharge status: Home / Self Care | Payer: 59 | Source: Ambulatory Visit | Attending: Surgery | Admitting: Surgery

## 2018-12-30 ENCOUNTER — Ambulatory Visit (INDEPENDENT_AMBULATORY_CARE_PROVIDER_SITE_OTHER): Payer: Self-pay | Admitting: Surgery

## 2018-12-30 ENCOUNTER — Encounter: Payer: Self-pay | Admitting: Surgery

## 2018-12-30 VITALS — BP 132/89 | HR 86 | Resp 16 | Ht 69.0 in | Wt 233.0 lb

## 2018-12-30 DIAGNOSIS — Z951 Presence of aortocoronary bypass graft: Secondary | ICD-10-CM

## 2018-12-30 DIAGNOSIS — I214 Non-ST elevation (NSTEMI) myocardial infarction: Secondary | ICD-10-CM

## 2018-12-30 NOTE — Addendum Note (Signed)
Addended by: Tommye Standard F on: 12/30/2018 03:08 PM   Modules accepted: Orders

## 2018-12-30 NOTE — Progress Notes (Signed)
     HPI: Patient returns for routine postoperative follow-up having undergone coronary artery bypass graft surgery x4 on 11/26/2018. The patient's early postoperative recovery while in the hospital was notable for uncomplicated postoperative course. Since hospital discharge the patient reports that he has been feeling well.  He is walking mostly in his house due to the weather but has had no chest pain or shortness of breath.  He is anxious to return to work.  He has been watching his diet closely and has lost some weight.  He is still smoking about 10 cigarettes/day but said that he did not want to quit smoking while he was trying to diet and lose weight.   Current Outpatient Medications  Medication Sig Dispense Refill  . aspirin EC 81 MG EC tablet Take 1 tablet (81 mg total) by mouth daily.    Marland Kitchen atorvastatin (LIPITOR) 80 MG tablet Take 1 tablet (80 mg total) by mouth daily at 6 PM. 90 tablet 3  . carvedilol (COREG) 3.125 MG tablet Take 1 tablet (3.125 mg total) by mouth 2 (two) times daily with a meal. 60 tablet 11  . nitroGLYCERIN (NITROSTAT) 0.4 MG SL tablet Place 0.4 mg under the tongue as needed.     No current facility-administered medications for this visit.     Physical Exam: BP 132/89 (BP Location: Right Arm, Patient Position: Sitting, Cuff Size: Large)   Pulse 86   Resp 16   Ht 5\' 9"  (1.753 m)   Wt 233 lb (105.7 kg)   SpO2 96% Comment: ON RA  BMI 34.41 kg/m  He looks well. Cardiac exam shows a regular rate and rhythm with normal heart sounds. Lung exam is clear. The chest incision is healing well and the sternum is stable. His leg incision is healing well and there is no lower extremity edema.  Diagnostic Tests:  Chest x-ray today shows a small left pleural effusion.  The lungs are otherwise clear.  Impression:  Overall I think he is making a good recovery following his surgery.  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  I told him that he could return to work on 01/04/2019 as long as he was not doing any heavy lifting or pushing or pulling.  I gave him a note to return to work.  We will refer him to cardiac rehab.  I think he can start that anytime now.  I strongly encouraged him to discontinue smoking.  He understands the importance of that.  Plan:  He has a follow-up appointment in April with Dr. Anne Fu and will return to see me if he has any problems with his incisions.   Alleen Borne, MD [facility] [phone]

## 2019-01-04 ENCOUNTER — Encounter (HOSPITAL_COMMUNITY): Payer: Self-pay

## 2019-01-04 ENCOUNTER — Telehealth (HOSPITAL_COMMUNITY): Payer: Self-pay

## 2019-01-04 NOTE — Telephone Encounter (Signed)
Attempted to call pt a 2nd time- LM ON VM ° °Mailed letter out °

## 2019-01-14 NOTE — Telephone Encounter (Signed)
Attempted to call patient in regards to Cardiac Rehab - to let pt know we are closed at this time due to the COVID-19 and will contact once we have resume scheduling.  °LMTCB °

## 2019-01-27 ENCOUNTER — Other Ambulatory Visit: Payer: 59

## 2019-02-03 ENCOUNTER — Telehealth: Payer: Self-pay

## 2019-02-03 NOTE — Telephone Encounter (Signed)
Webex appt 4/15 9 am.

## 2019-02-03 NOTE — Telephone Encounter (Signed)
Virtual Visit Pre-Appointment Phone Call  Steps For Call:  1. Confirm consent - "In the setting of the current Covid19 crisis, you are scheduled for a (phone or video) visit with your provider on (date) at (time).  Just as we do with many in-office visits, in order for you to participate in this visit, we must obtain consent.  If you'd like, I can send this to your mychart (if signed up) or email for you to review.  Otherwise, I can obtain your verbal consent now.  All virtual visits are billed to your insurance company just like a normal visit would be.  By agreeing to a virtual visit, we'd like you to understand that the technology does not allow for your provider to perform an examination, and thus may limit your provider's ability to fully assess your condition.  Finally, though the technology is pretty good, we cannot assure that it will always work on either your or our end, and in the setting of a video visit, we may have to convert it to a phone-only visit.  In either situation, we cannot ensure that we have a secure connection.  Are you willing to proceed?"  2. Give patient instructions for WebEx download to smartphone as below if video visit  3. Advise patient to be prepared with any vital sign or heart rhythm information, their current medicines, and a piece of paper and pen handy for any instructions they may receive the day of their visit  4. Inform patient they will receive a phone call 15 minutes prior to their appointment time (may be from unknown caller ID) so they should be prepared to answer  5. Confirm that appointment type is correct in Epic appointment notes (video vs telephone)    TELEPHONE CALL NOTE  Gabriel Green has been deemed a candidate for a follow-up tele-health visit to limit community exposure during the Covid-19 pandemic. I spoke with the patient via phone to ensure availability of phone/video source, confirm preferred email & phone number, and discuss  instructions and expectations.  I reminded Gabriel Green to be prepared with any vital sign and/or heart rhythm information that could potentially be obtained via home monitoring, at the time of his visit. I reminded Gabriel Green to expect a phone call at the time of his visit if his visit.  Did the patient verbally acknowledge consent to treatment? YES  [name], [place] 02/03/2019 10:31 AM   DOWNLOADING THE WEBEX SOFTWARE TO SMARTPHONE  - If Apple, go to [employer] and type in WebEx in the search bar. Download [employer], the blue/green circle. The app is free but as with any other app downloads, their phone may require them to verify saved payment information or Apple password. The patient does NOT have to create an account.  - If Android, ask patient to go to [employer] and type in WebEx in the search bar. Download [employer], the blue/green circle. The app is free but as with any other app downloads, their phone may require them to verify saved payment information or Android password. The patient does NOT have to create an account.   CONSENT FOR TELE-HEALTH VISIT - PLEASE REVIEW  I hereby voluntarily request, consent and authorize [facility] and its employed or contracted physicians, physician assistants, nurse practitioners or other licensed health care professionals (the Practitioner), to provide me with telemedicine health care services (the "Services") as deemed necessary by the treating Practitioner. I acknowledge  and consent to receive the Services by the Practitioner via telemedicine. I understand that the telemedicine visit will involve communicating with the Practitioner through live audiovisual communication technology and the disclosure of certain medical information by electronic transmission. I acknowledge that I have been given the opportunity to request an in-person assessment or other available alternative prior to the telemedicine visit  and am voluntarily participating in the telemedicine visit.  I understand that I have the right to withhold or withdraw my consent to the use of telemedicine in the course of my care at any time, without affecting my right to future care or treatment, and that the Practitioner or I may terminate the telemedicine visit at any time. I understand that I have the right to inspect all information obtained and/or recorded in the course of the telemedicine visit and may receive copies of available information for a reasonable fee.  I understand that some of the potential risks of receiving the Services via telemedicine include:  Marland Kitchen. Delay or interruption in medical evaluation due to technological equipment failure or disruption; . Information transmitted may not be sufficient (e.g. poor resolution of images) to allow for appropriate medical decision making by the Practitioner; and/or  . In rare instances, security protocols could fail, causing a breach of personal health information.  Furthermore, I acknowledge that it is my responsibility to provide information about my medical history, conditions and care that is complete and accurate to the best of my ability. I acknowledge that Practitioner's advice, recommendations, and/or decision may be based on factors not within their control, such as incomplete or inaccurate data provided by me or distortions of diagnostic images or specimens that may result from electronic transmissions. I understand that the practice of medicine is not an exact science and that Practitioner makes no warranties or guarantees regarding treatment outcomes. I acknowledge that I will receive a copy of this consent concurrently upon execution via email to the email address I last provided but may also request a printed copy by calling the office of [facility].    I understand that my insurance will be billed for this visit.   I have read or had this consent read to me. . I understand  the contents of this consent, which adequately explains the benefits and risks of the Services being provided via telemedicine.  . I have been provided ample opportunity to ask questions regarding this consent and the Services and have had my questions answered to my satisfaction. . I give my informed consent for the services to be provided through the use of telemedicine in my medical care  By participating in this telemedicine visit I agree to the above.

## 2019-02-10 ENCOUNTER — Telehealth (INDEPENDENT_AMBULATORY_CARE_PROVIDER_SITE_OTHER): Payer: Commercial Managed Care - PPO | Admitting: Cardiology

## 2019-02-10 ENCOUNTER — Other Ambulatory Visit: Payer: Self-pay

## 2019-02-10 ENCOUNTER — Encounter: Payer: Self-pay | Admitting: Cardiology

## 2019-02-10 VITALS — Ht 69.0 in | Wt 227.0 lb

## 2019-02-10 DIAGNOSIS — R7303 Prediabetes: Secondary | ICD-10-CM

## 2019-02-10 DIAGNOSIS — I2510 Atherosclerotic heart disease of native coronary artery without angina pectoris: Secondary | ICD-10-CM | POA: Insufficient documentation

## 2019-02-10 DIAGNOSIS — E785 Hyperlipidemia, unspecified: Secondary | ICD-10-CM

## 2019-02-10 DIAGNOSIS — Z720 Tobacco use: Secondary | ICD-10-CM | POA: Insufficient documentation

## 2019-02-10 DIAGNOSIS — Z951 Presence of aortocoronary bypass graft: Secondary | ICD-10-CM

## 2019-02-10 NOTE — Progress Notes (Signed)
Virtual Visit via Video Note   This visit type was conducted due to national recommendations for restrictions regarding the COVID-19 Pandemic (e.g. social distancing) in an effort to limit this patient's exposure and mitigate transmission in our community.  Due to his co-morbid illnesses, this patient is at least at moderate risk for complications without adequate follow up.  This format is felt to be most appropriate for this patient at this time.  All issues noted in this document were discussed and addressed.  A limited physical exam was performed with this format.  Please refer to the patient's chart for his consent to telehealth for [facility].   Evaluation Performed:  Follow-up visit  Date:  02/10/2019   ID:  Gabriel Green, DOB 1961/01/05, MRN [id number]  Patient Location: Home Provider Location: Home  PCP:  Patient, No Pcp Per  Cardiologist:  Donato Schultz, MD  Electrophysiologist:  None   Chief Complaint: Follow-up of coronary artery disease post CABG post non-ST elevation myocardial infarction  History of Present Illness:    Gabriel Green is a 58 y.o. male with coronary artery disease status post CABG times 41/30/20-LAD LIMA, SVG to diagonal, SVG to obtuse marginal 1, SVG to PDA here for follow-up.  This was in the setting of non-ST elevation myocardial infarction.  Original symptoms were intermittent chest discomfort and exertional substernal chest pain sometimes occurring when lying down at night radiating to the left forearm associated with dyspnea lasting a few minutes in duration resolved sitting up in bed.  Traveling to work in [place] developed recurrent chest discomfort.  Came back home after a hospitalization in cardiac catheterization at his own risks.  Reached out to Dr. Riley Kill.  He was then seen in our clinic.  There was a delay in getting the angiogram films from [place] but ultimately this was obtained and surgery performed.  Echo demonstrated normal EF- cardiac  catheterization showed 70% left main with dampening 70 to 80% LAD 70 to 80% left circumflex 90% mid RCA with left-to-right collaterals.  Down to 227 from 251. Goal of 190.  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  Cleaning up the house.    States that his chest discomfort from surgery has subsided.  Now that he can lay on his left side he is doing much better.  The patient does not have symptoms concerning for COVID-19 infection (fever, chills, cough, or new shortness of breath).    Past Medical History:  Diagnosis Date  . Alcohol use   . Borderline diabetes    a. 10/2018 HbA1c = 6.3.  . CAD (coronary artery disease)    a. 11/10/2018 NSTEMI/Cath [facility], [place]): LM 70ost (dampening), LAD 70-80p, D1 20-30, LCX 70-80p, RCA 59m w/ L->R collats, RPDA/RPL min irregs.  Marland Kitchen Dyspnea    with walking  . History of echocardiogram    a. 11/10/2017 Echo: Nl LV fxn. Triv MR/TR.  Marland Kitchen History of kidney stones   . Hypertension   . Morbid obesity (HCC)   . Tobacco abuse    Past Surgical History:  Procedure Laterality Date  . CORONARY ARTERY BYPASS GRAFT N/A 11/26/2018   Procedure: CORONARY ARTERY BYPASS GRAFTING (CABG) times three;  Surgeon: Alleen Borne, MD;  Location: [facility] OR;  Service: Open Heart Surgery;  Laterality: N/A;  . ENDOVEIN HARVEST OF GREATER SAPHENOUS VEIN Right 11/26/2018   Procedure: Mack Guise Of Greater Saphenous Vein;  Surgeon: Alleen Borne, MD;  Location:  [facility] OR;  Service: Open Heart Surgery;  Laterality: Right;  . TEE WITHOUT CARDIOVERSION N/A 11/26/2018   Procedure: TRANSESOPHAGEAL ECHOCARDIOGRAM (TEE);  Surgeon: Alleen BorneBartle, Bryan K, MD;  [facility];  Service: Open Heart Surgery;  Laterality: N/A;  . TONSILLECTOMY       Current Meds  Medication Sig  . aspirin EC 81 MG EC tablet Take 1 tablet (81 mg total) by mouth daily.  Marland Kitchen. atorvastatin (LIPITOR) 80 MG tablet Take 1 tablet (80 mg total) by mouth daily at 6 PM.  .  carvedilol (COREG) 3.125 MG tablet Take 1 tablet (3.125 mg total) by mouth 2 (two) times daily with a meal.  . nitroGLYCERIN (NITROSTAT) 0.4 MG SL tablet Place 0.4 mg under the tongue as needed.     Allergies:   Patient has no known allergies.   Social History   Tobacco Use  . Smoking status: Current Every Day Smoker    Packs/day: 1.00    Years: 40.00    Pack years: 40.00    Types: Cigarettes  . Smokeless tobacco: Never Used  . Tobacco comment: pt.stated "will be going on the patch"  Substance Use Topics  . Alcohol use: Yes    Comment: at least a 12 pack of beer/wk.  . Drug use: No     Family Hx: The patient's family history includes Alzheimer's disease in his mother; High blood pressure in his father; Other in his brother and brother; Thyroid disease in his sister.  ROS:   Please see the history of present illness.    Denies any fevers chills nausea vomiting syncope bleeding All other systems reviewed and are negative.   Prior CV studies:   The following studies were reviewed today:  Echo-normal EF, cardiac catheterization triple-vessel disease as above.  Labs/Other Tests and Data Reviewed:    EKG:  Prior EKG reviewed shows sinus rhythm  Recent Labs: 11/17/2018: ALT 29 11/27/2018: Magnesium 2.3 12/15/2018: BUN 8; Creatinine, Ser 0.93; Hemoglobin 13.6; Platelets 491; Potassium 4.8; Sodium 139   Recent Lipid Panel No results found for: CHOL, TRIG, HDL, CHOLHDL, LDLCALC, LDLDIRECT  Wt Readings from Last 3 Encounters:  02/10/19 227 lb (103 kg)  12/30/18 233 lb (105.7 kg)  12/15/18 240 lb 3.2 oz (109 kg)     Objective:    Vital Signs:  Ht 5\' 9"  (1.753 m)   Wt 227 lb (103 kg)   BMI 33.52 kg/m    Well nourished, well developed male in no acute distress.  Alert and oriented 3, normal respiratory effort.   ASSESSMENT & PLAN:    CAD post CABG - CABG times 4 on 11/26/2018.  Continue with aggressive secondary prevention.  Medications reviewed.  Dr. Sharee PimpleBartle's note  reviewed. Coreg.  Low-dose, beta-blocker for protection.  Continue to work on exercise.  Good job with weight loss.  Tobacco use -Continue to encourage cessation.   increased risk for future MI.  1/2ppd. Did well with patch in future.  He is working on weight first.  Hyperlipidemia -Continue with high intensity statin atorvastatin 80.  Borderline diabetes -Hemoglobin A1c previously 6.5.  He has been following wife's diabetic diet.  2-5 pounds per week. No ETOH. Excellent job with weight loss.   COVID-19 Education: The signs and symptoms of COVID-19 were discussed with the patient and how to seek care for testing (follow up with PCP or arrange E-visit).  The importance of social distancing was discussed today.  Time:   Today, I have spent 17 minutes with the  patient with telehealth technology discussing the above problems.     Medication Adjustments/Labs and Tests Ordered: Current medicines are reviewed at length with the patient today.  Concerns regarding medicines are outlined above.   Tests Ordered: No orders of the defined types were placed in this encounter.   Medication Changes: No orders of the defined types were placed in this encounter.   Disposition:  Follow up in 4 month(s)  Signed, Donato Schultz, MD  02/10/2019 9:48 AM    [facility]

## 2019-02-10 NOTE — Patient Instructions (Signed)
Medication Instructions:  Your provider recommends that you continue on your current medications as directed. Please refer to the Current Medication list given to you today.    Follow-Up: Your provider recommends that you schedule a follow-up appointment in 4 months with Dr. Anne Fu.

## 2019-02-11 ENCOUNTER — Ambulatory Visit: Payer: Commercial Managed Care - PPO | Admitting: Cardiology

## 2019-02-11 ENCOUNTER — Ambulatory Visit: Payer: 59 | Admitting: Cardiology

## 2019-06-15 ENCOUNTER — Ambulatory Visit: Payer: Commercial Managed Care - PPO | Admitting: Cardiology

## 2019-07-27 ENCOUNTER — Encounter: Payer: Self-pay | Admitting: Cardiology

## 2019-07-27 ENCOUNTER — Other Ambulatory Visit: Payer: Self-pay

## 2019-07-27 ENCOUNTER — Telehealth (INDEPENDENT_AMBULATORY_CARE_PROVIDER_SITE_OTHER): Payer: Commercial Managed Care - PPO | Admitting: Cardiology

## 2019-07-27 VITALS — Ht 69.0 in | Wt 238.0 lb

## 2019-07-27 DIAGNOSIS — I2510 Atherosclerotic heart disease of native coronary artery without angina pectoris: Secondary | ICD-10-CM | POA: Diagnosis not present

## 2019-07-27 DIAGNOSIS — Z720 Tobacco use: Secondary | ICD-10-CM

## 2019-07-27 DIAGNOSIS — E785 Hyperlipidemia, unspecified: Secondary | ICD-10-CM

## 2019-07-27 DIAGNOSIS — Z951 Presence of aortocoronary bypass graft: Secondary | ICD-10-CM | POA: Diagnosis not present

## 2019-07-27 DIAGNOSIS — Z79899 Other long term (current) drug therapy: Secondary | ICD-10-CM

## 2019-07-27 NOTE — Patient Instructions (Signed)
Medication Instructions:  The current medical regimen is effective;  continue present plan and medications.  If you need a refill on your cardiac medications before your next appointment, please call your pharmacy.   Lab work: Please come in fasting for lab work (Lipid, CMP and CBC).  If you have labs (blood work) drawn today and your tests are completely normal, you will receive your results only by: Marland Kitchen MyChart Message (if you have MyChart) OR . A paper copy in the mail If you have any lab test that is abnormal or we need to change your treatment, we will call you to review the results.  Follow-Up: At [facility], you and your health needs are our priority.  As part of our continuing mission to provide you with exceptional heart care, we have created designated Provider Care Teams.  These Care Teams include your primary Cardiologist (physician) and Advanced Practice Providers (APPs -  Physician Assistants and Nurse Practitioners) who all work together to provide you with the care you need, when you need it. You will need a follow up appointment in 12 months.  Please call our office 2 months in advance to schedule this appointment.  You may see Candee Furbish, MD or one of the following Advanced Practice Providers on your designated Care Team:   Truitt Merle, NP Cecilie Kicks, NP . Kathyrn Drown, NP  .  Thank you for choosing [facility]!!

## 2019-07-27 NOTE — Progress Notes (Signed)
Virtual Visit via Video Note   This visit type was conducted due to national recommendations for restrictions regarding the COVID-19 Pandemic (e.g. social distancing) in an effort to limit this patient's exposure and mitigate transmission in our community.  Due to his co-morbid illnesses, this patient is at least at moderate risk for complications without adequate follow up.  This format is felt to be most appropriate for this patient at this time.  All issues noted in this document were discussed and addressed.  A limited physical exam was performed with this format.  Please refer to the patient's chart for his consent to telehealth for [facility].   Evaluation Performed:  Follow-up visit  Date:  07/27/2019   ID:  Gabriel Green, DOB 05-22-1961, MRN [id number]  Patient Location: Home Provider Location: Home  PCP:  Patient, No Pcp Per  Cardiologist:  Donato SchultzMark Skains, MD  Electrophysiologist:  None   Chief Complaint: Follow-up of coronary artery disease post CABG post non-ST elevation myocardial infarction  History of Present Illness:    Gabriel Green is a 58 y.o. male with coronary artery disease status post CABG times 4 on 11/26/18-LAD LIMA, SVG to diagonal, SVG to obtuse marginal 1, SVG to PDA here for follow-up of CAD.  This was in the setting of non-ST elevation myocardial infarction.  Original symptoms were intermittent chest discomfort and exertional substernal chest pain sometimes occurring when lying down at night radiating to the left forearm associated with dyspnea lasting a few minutes in duration resolved sitting up in bed.  [place] developed recurrent chest discomfort.  Came back home after a hospitalization in cardiac catheterization at his own risks.  Reached out to Dr. Riley KillStuckey.  He was then seen in our clinic.  There was a delay in getting the angiogram films from [place] but ultimately this was obtained and surgery performed.  Echo demonstrated normal  EF- cardiac catheterization showed 70% left main with dampening 70 to 80% LAD 70 to 80% left circumflex 90% mid RCA with left-to-right collaterals.  Down to 227 from 251. Goal of 190.  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  Cleaning up the house.    States that his chest discomfort from surgery has subsided.  Now that he can lay on his left side he is doing much better.  Golfing again.  Overall not having any chest pain fevers chills nausea vomiting syncope bleeding.  Challenging to lose weight in [place] he states.  Not drinking beer.  Has an occasional alcohol beverage.  Still smoking.  Trying to lose weight first.  The patient does not have symptoms concerning for COVID-19 infection (fever, chills, cough, or new shortness of breath).    Past Medical History:  Diagnosis Date  . Alcohol use   . Borderline diabetes    a. 10/2018 HbA1c = 6.3.  . CAD (coronary artery disease)    a. 11/10/2018 NSTEMI/Cath [facility], [place]): LM 70ost (dampening), LAD 70-80p, D1 20-30, LCX 70-80p, RCA 3468m w/ L->R collats, RPDA/RPL min irregs.  Marland Kitchen. Dyspnea    with walking  . History of echocardiogram    a. 11/10/2017 Echo: Nl LV fxn. Triv MR/TR.  Marland Kitchen. History of kidney stones   . Hypertension   . Morbid obesity (HCC)   . Tobacco abuse    Past Surgical History:  Procedure Laterality Date  . CORONARY ARTERY BYPASS GRAFT N/A 11/26/2018   Procedure: CORONARY ARTERY BYPASS GRAFTING (  CABG) times three;  Surgeon: Alleen Borne, MD;  Location: [facility] OR;  Service: Open Heart Surgery;  Laterality: N/A;  . ENDOVEIN HARVEST OF GREATER SAPHENOUS VEIN Right 11/26/2018   Procedure: Mack Guise Of Greater Saphenous Vein;  Surgeon: Alleen Borne, MD;  Location: [facility] OR;  Service: Open Heart Surgery;  Laterality: Right;  . TEE WITHOUT CARDIOVERSION N/A 11/26/2018   Procedure: TRANSESOPHAGEAL ECHOCARDIOGRAM (TEE);  Surgeon: Alleen Borne, MD;  Location: [facility] OR;  Service: Open  Heart Surgery;  Laterality: N/A;  . TONSILLECTOMY       Current Meds  Medication Sig  . aspirin EC 81 MG EC tablet Take 1 tablet (81 mg total) by mouth daily.  Marland Kitchen atorvastatin (LIPITOR) 80 MG tablet Take 1 tablet (80 mg total) by mouth daily at 6 PM.  . carvedilol (COREG) 3.125 MG tablet Take 1 tablet (3.125 mg total) by mouth 2 (two) times daily with a meal.  . nitroGLYCERIN (NITROSTAT) 0.4 MG SL tablet Place 0.4 mg under the tongue as needed.     Allergies:   Patient has no known allergies.   Social History   Tobacco Use  . Smoking status: Current Every Day Smoker    Packs/day: 1.00    Years: 40.00    Pack years: 40.00    Types: Cigarettes  . Smokeless tobacco: Never Used  . Tobacco comment: pt.stated "will be going on the patch"  Substance Use Topics  . Alcohol use: Yes    Comment: at least a 12 pack of beer/wk.  . Drug use: No     Family Hx: The patient's family history includes Alzheimer's disease in his mother; High blood pressure in his father; Other in his brother and brother; Thyroid disease in his sister.  ROS:   Please see the history of present illness.     All other systems reviewed and are negative.   Prior CV studies:   The following studies were reviewed today:  Echo-normal EF, cardiac catheterization triple-vessel disease as above.  Labs/Other Tests and Data Reviewed:    EKG:  Prior EKG reviewed shows sinus rhythm No new EKG Recent Labs: 11/17/2018: ALT 29 11/27/2018: Magnesium 2.3 12/15/2018: BUN 8; Creatinine, Ser 0.93; Hemoglobin 13.6; Platelets 491; Potassium 4.8; Sodium 139   Recent Lipid Panel No results found for: CHOL, TRIG, HDL, CHOLHDL, LDLCALC, LDLDIRECT  Wt Readings from Last 3 Encounters:  07/27/19 238 lb (108 kg)  02/10/19 227 lb (103 kg)  12/30/18 233 lb (105.7 kg)     Objective:    Vital Signs:  Ht 5\' 9"  (1.753 m)   Wt 238 lb (108 kg)   BMI 35.15 kg/m    Pleasant, able to complete full sentences without difficulty.    ASSESSMENT & PLAN:    CAD post CABG - CABG times 4 on 11/26/2018.  Continue with aggressive secondary prevention.  Medications reviewed.  Dr. 11/28/2018 note reviewed. Coreg.  Low-dose, beta-blocker for protection.  Continue to work on exercise. [place] for business,  weight loss challenging. Golfing well, no problems  Tobacco use -Continue to encourage cessation.   increased risk for future MI.  1/2ppd. Did well with patch in future.  He is working on Sharee Pimple first. Can't do both he states.  Hyperlipidemia -Continue with high intensity statin atorvastatin 80. Will check.   Borderline diabetes -Hemoglobin A1c previously 6.5.  He has been following wife's diabetic diet.  2-5 pounds per week. No ETOH.    COVID-19 Education: The signs and symptoms of  COVID-19 were discussed with the patient and how to seek care for testing (follow up with PCP or arrange E-visit).  The importance of social distancing was discussed today.  Time:   Today, I have spent 11 minutes with the patient with telehealth technology discussing the above problems.     Medication Adjustments/Labs and Tests Ordered: Current medicines are reviewed at length with the patient today.  Concerns regarding medicines are outlined above.   Tests Ordered: Orders Placed This Encounter  Procedures  . Comprehensive metabolic panel  . Lipid panel  . CBC    Medication Changes: No orders of the defined types were placed in this encounter.  We will follow-up with lab work  Disposition:  Follow up in 1 year(s)  Signed, Candee Furbish, MD  07/27/2019 9:00 AM    [place]

## 2019-10-31 ENCOUNTER — Other Ambulatory Visit: Payer: Self-pay | Admitting: Physician Assistant

## 2020-03-24 IMAGING — CR DG CHEST 2V
2 series · 2 of 2 positions shown · non-contrast
Comparison: March 11, 2014

CLINICAL DATA: Cardiac episode today

EXAM:
CHEST - 2 VIEW

[chest pa]
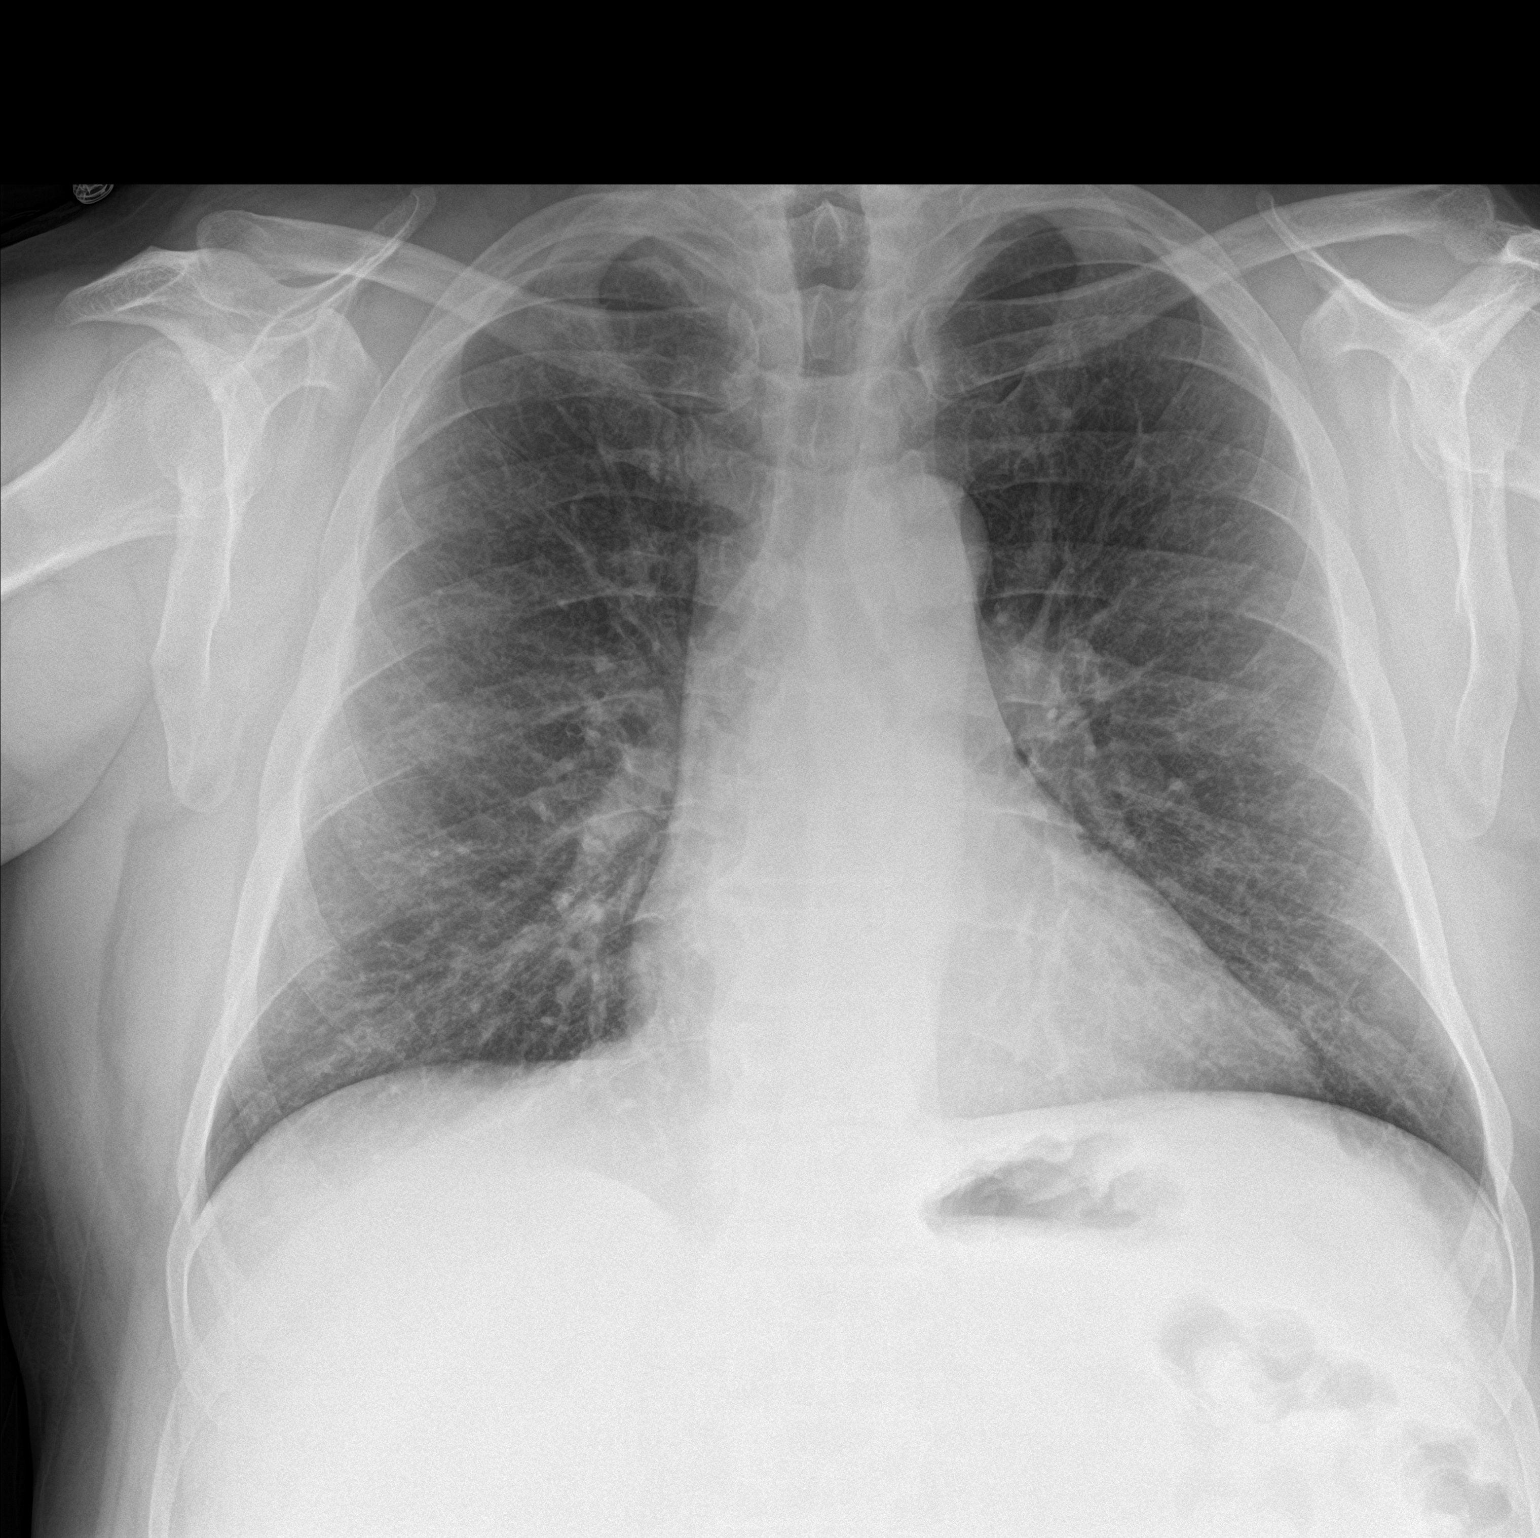

[chest lat]
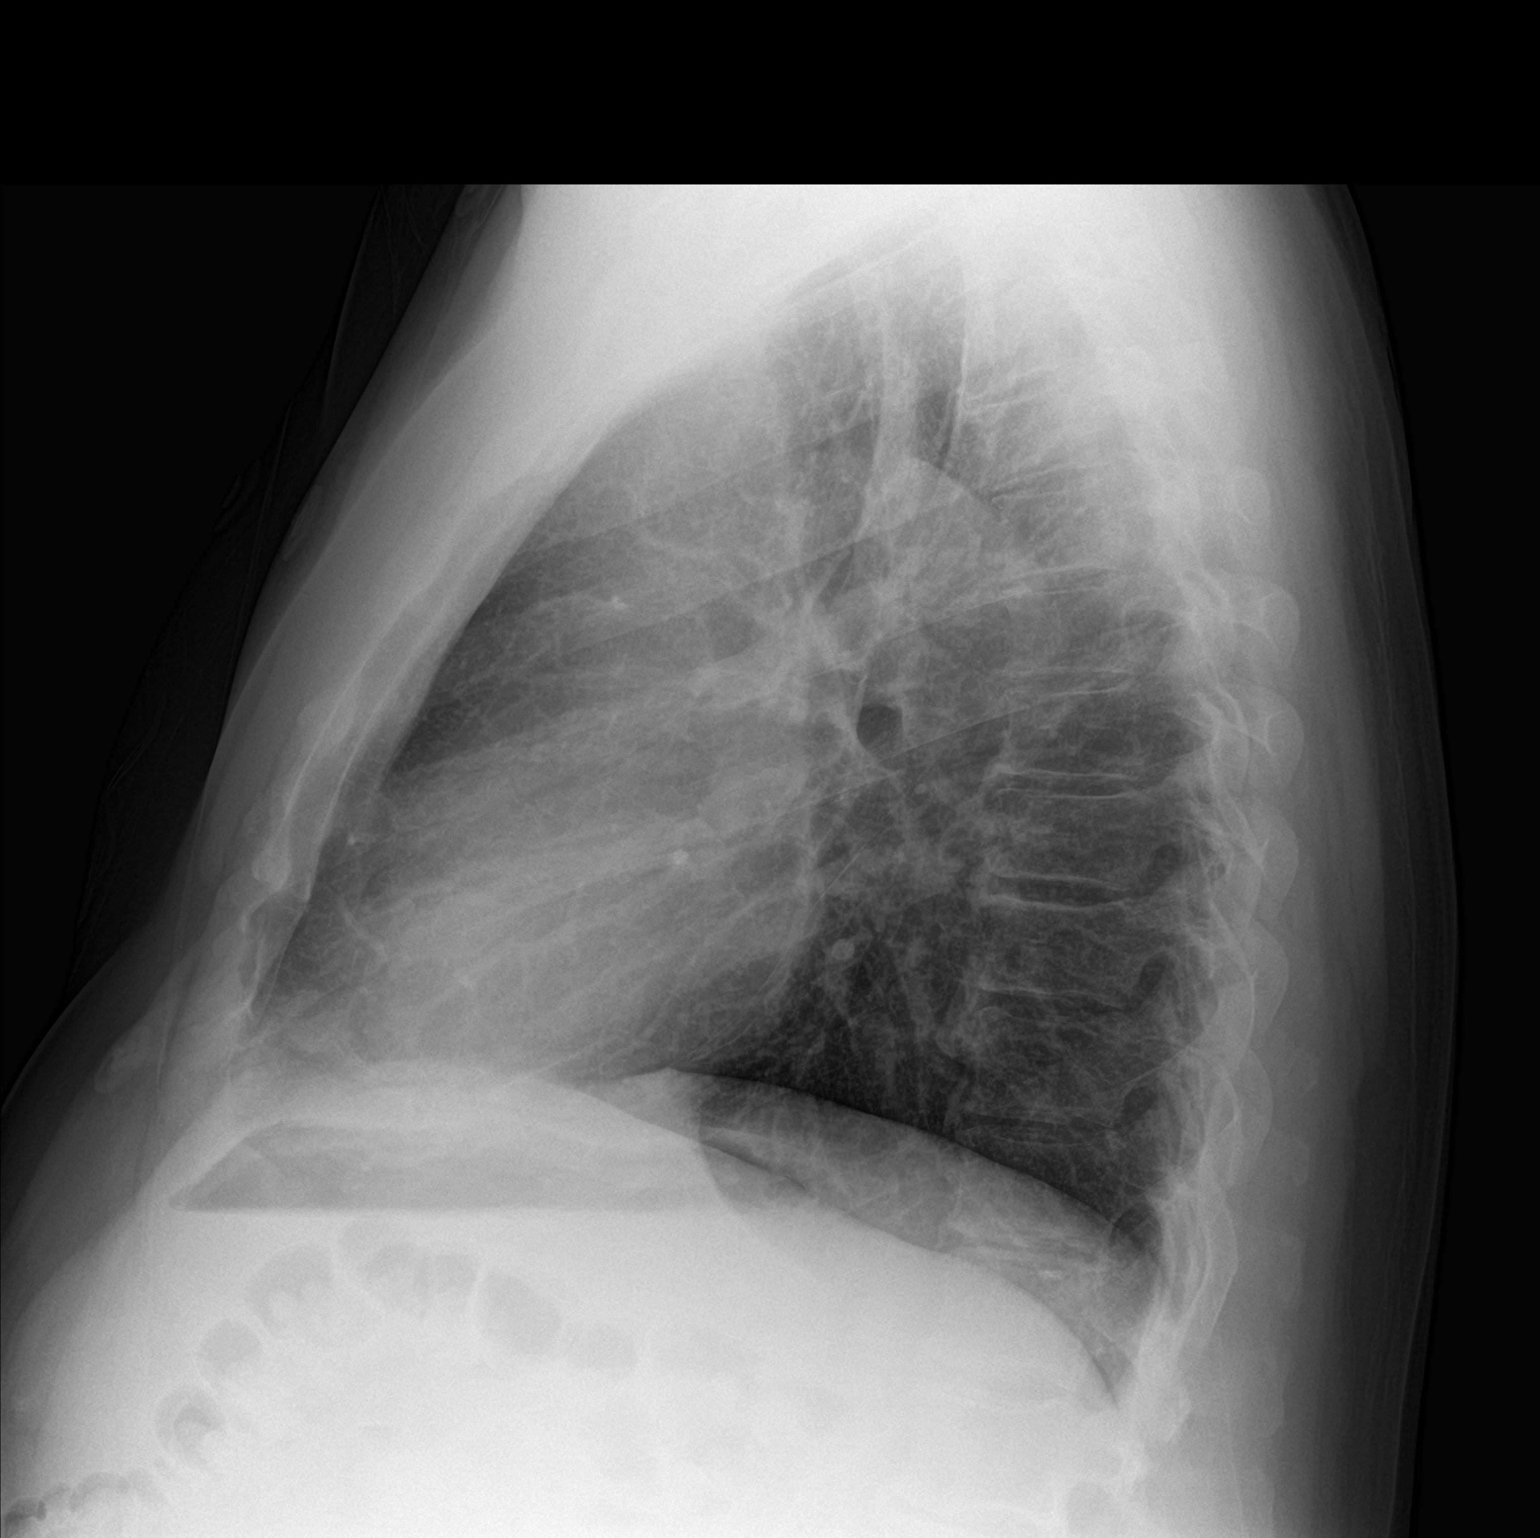

[2 of 2 positions shown; findings below may reference images not displayed]

FINDINGS: The heart size and mediastinal contours are within normal limits.
Both lungs are clear. The visualized skeletal structures are
unremarkable.
IMPRESSION: No active cardiopulmonary disease.

## 2020-04-07 IMAGING — DX DG CHEST 1V PORT
1 series · 1 of 1 positions shown · non-contrast
Comparison: 11/26/2018.

CLINICAL DATA: Chest tube.  CABG.  Sore chest.

EXAM:
PORTABLE CHEST 1 VIEW

[chest]
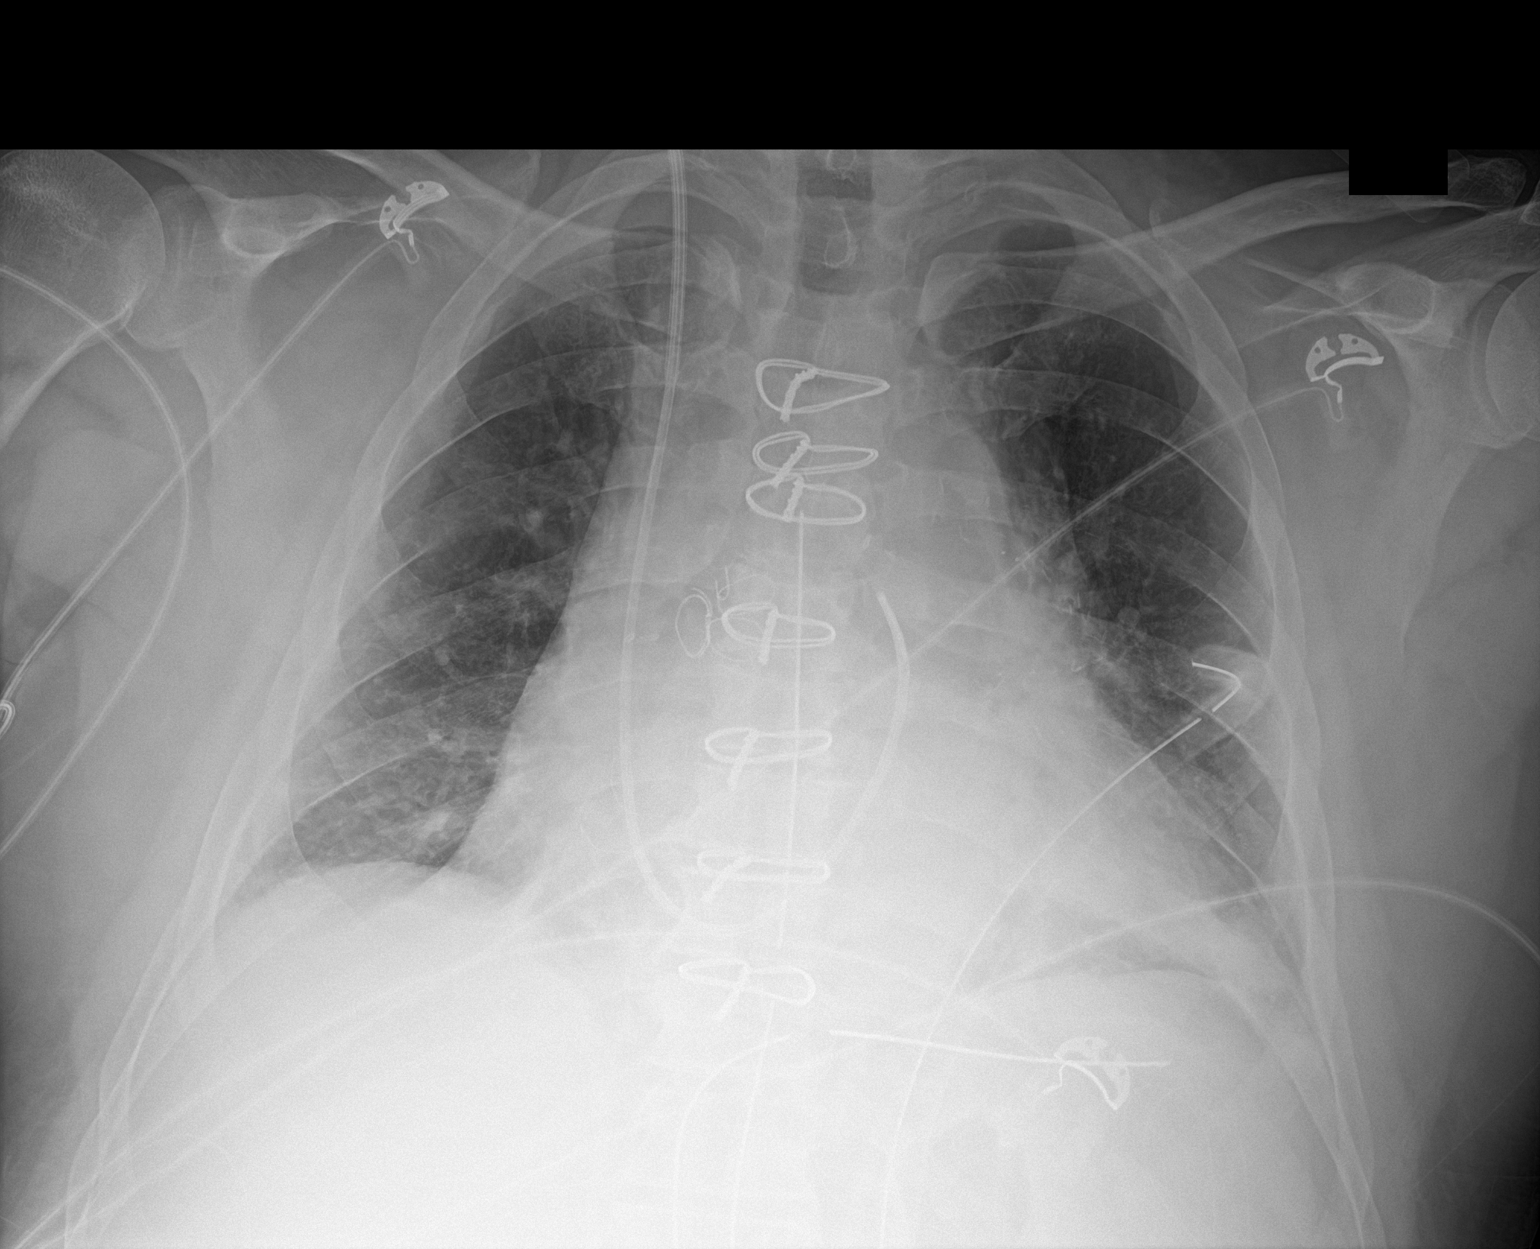

[1 of 1 positions shown; findings below may reference images not displayed]

FINDINGS: Interim removal of endotracheal tube and NG tube. Swan-Ganz
catheter, left chest tube, mediastinal drainage catheter in stable
position. Prior CABG. Stable cardiomegaly. Left base
atelectasis/infiltrate again noted. Mild right base
atelectasis/infiltrate noted on today's exam. Stable right-sided
pleural thickening. Scratched it stable bilateral pleural
thickening. No pneumothorax.
IMPRESSION: 1.  Interim removal of endotracheal tube and NG tube.

2.  No pulmonary venous congestion.

3. Left base atelectasis/infiltrate again noted. Mild right base
atelectasis/infiltrate noted on today's exam.

## 2020-04-30 ENCOUNTER — Other Ambulatory Visit: Payer: Self-pay | Admitting: Physician Assistant

## 2020-05-10 IMAGING — DX DG CHEST 2V
2 series · 2 of 2 positions shown · non-contrast
Comparison: 11/28/2018

CLINICAL DATA: Status post CABG on 11/26/2018

EXAM:
CHEST - 2 VIEW

[dg chest 2 view (1 of 2)]
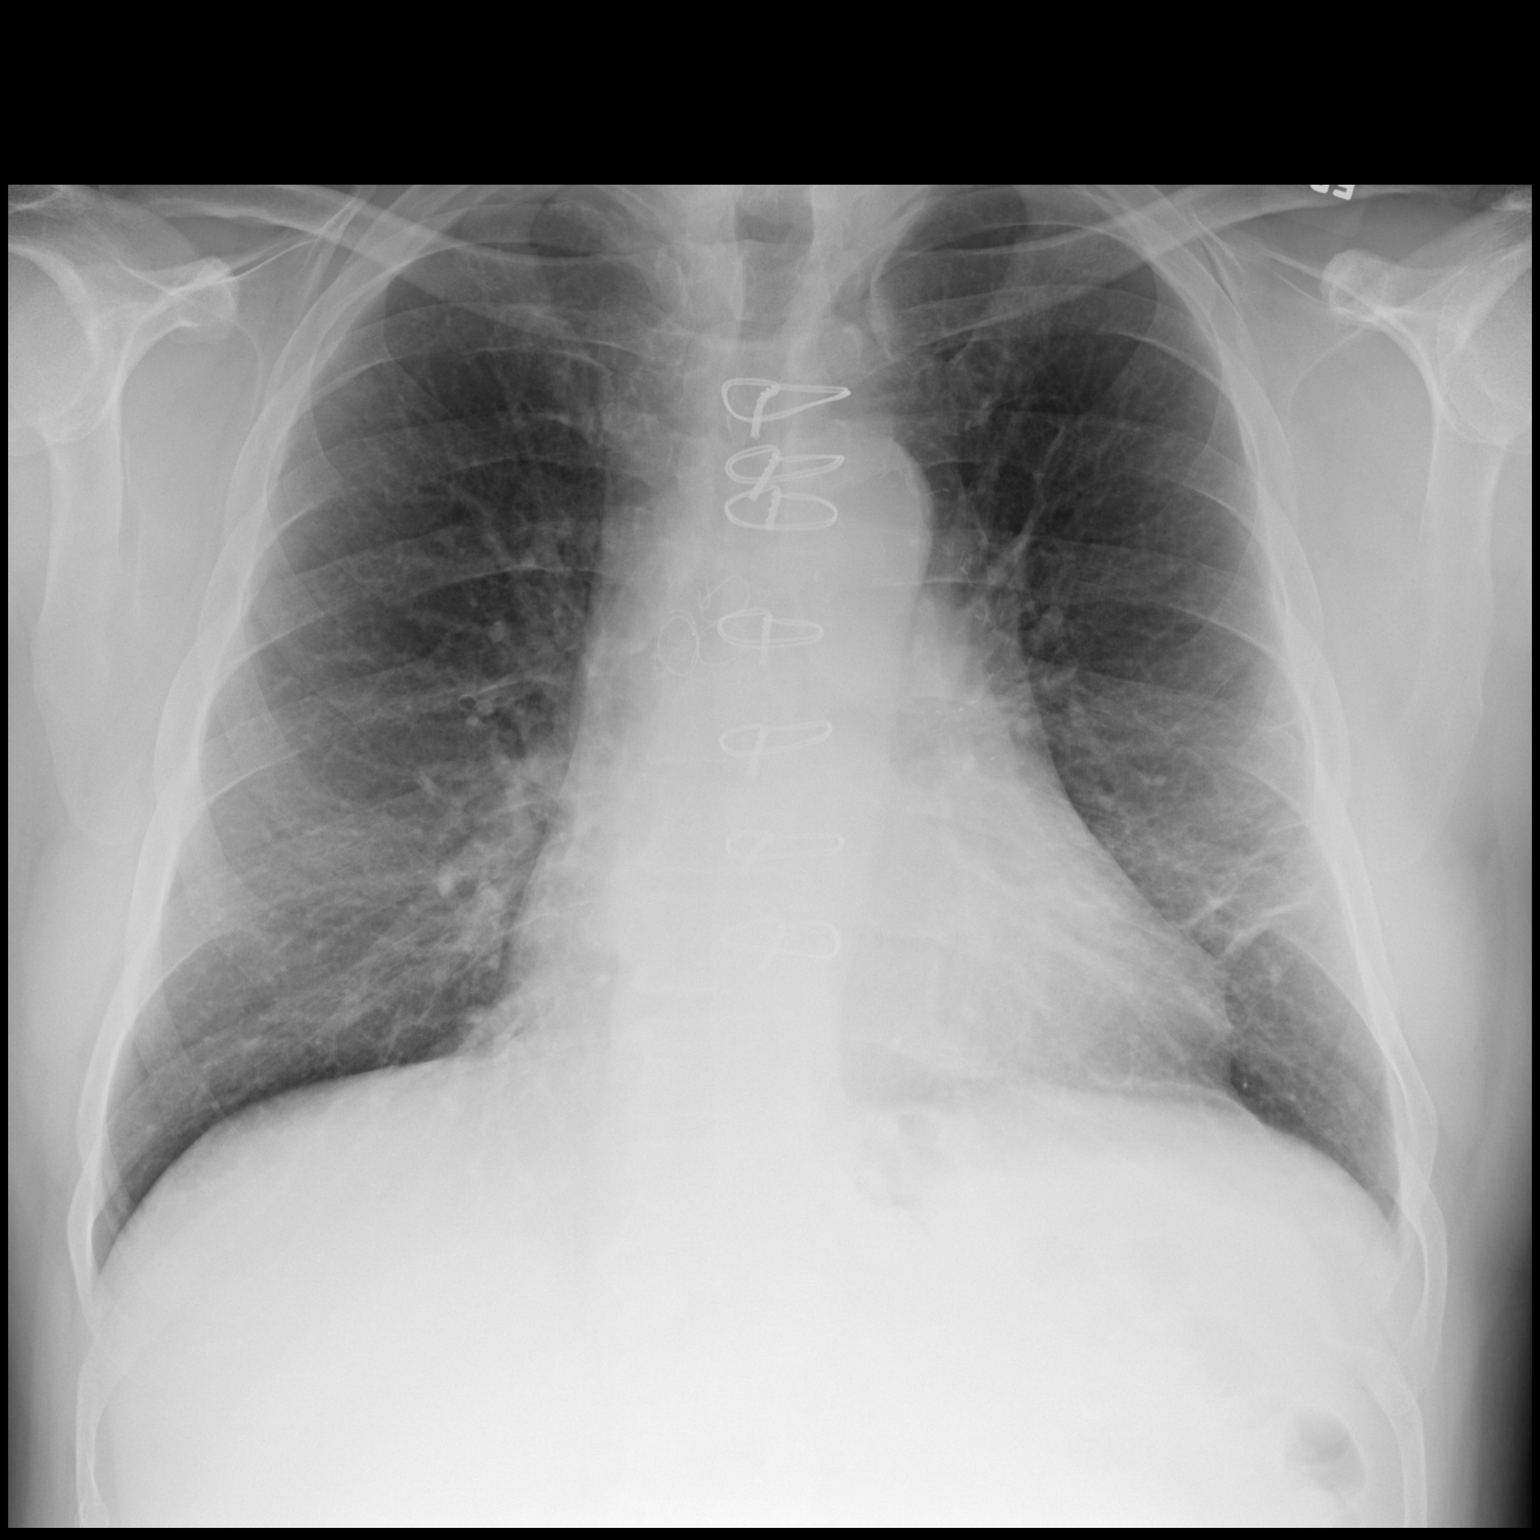

[dg chest 2 view (2 of 2)]
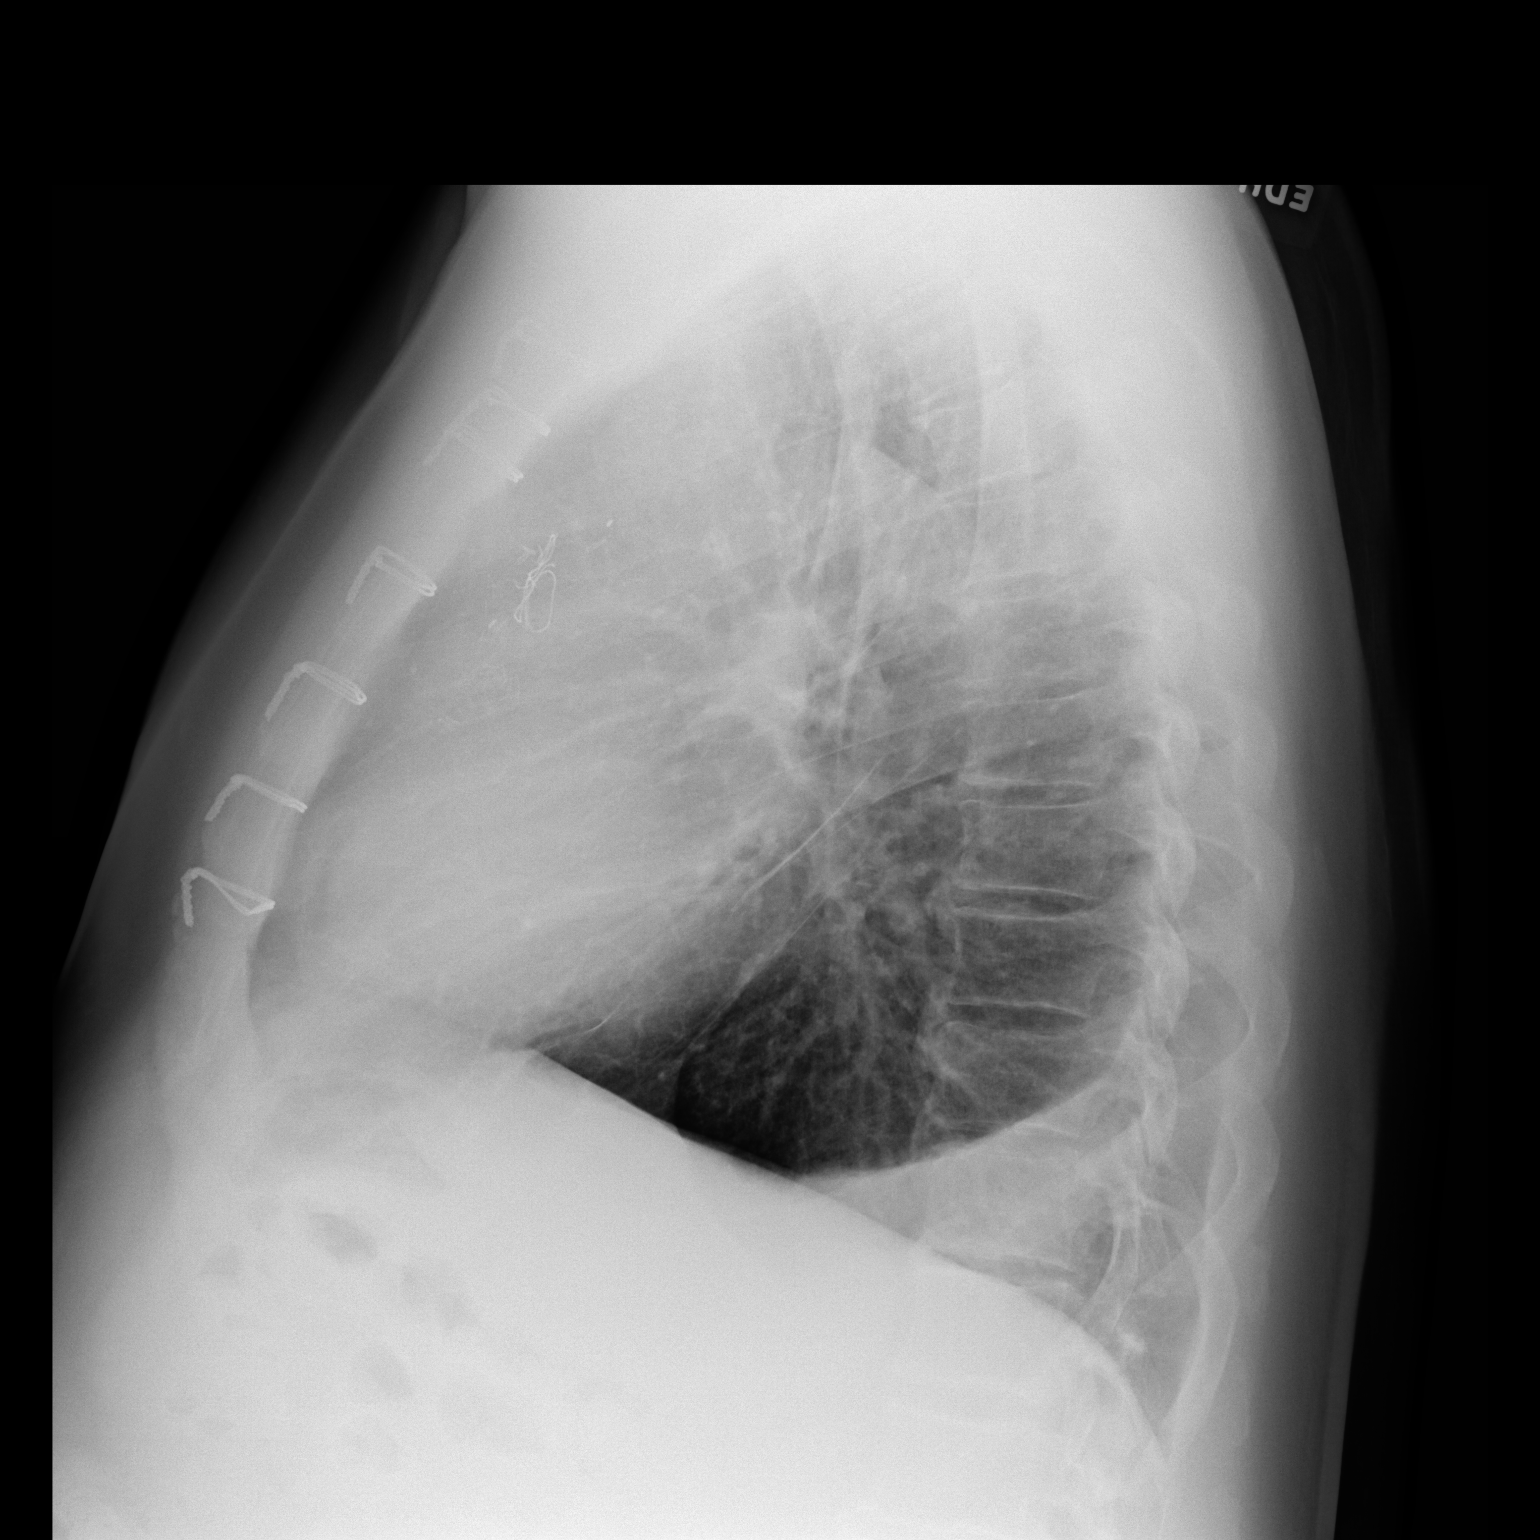

[2 of 2 positions shown; findings below may reference images not displayed]

FINDINGS: Stable heart size. Mild scarring and atelectasis in the left lower
lung. There may be a small amount of left pleural fluid. No edema,
airspace consolidation or pneumothorax.
IMPRESSION: Mild scarring and atelectasis in the left lower lung. Probable small
left pleural effusion.

## 2020-10-24 ENCOUNTER — Other Ambulatory Visit: Payer: Self-pay | Admitting: Cardiology

## 2020-11-20 ENCOUNTER — Other Ambulatory Visit: Payer: Self-pay | Admitting: Cardiology

## 2020-11-20 ENCOUNTER — Other Ambulatory Visit: Payer: Self-pay

## 2020-11-20 MED ORDER — CARVEDILOL 3.125 MG PO TABS: ORAL_TABLET | ORAL | 0 refills | Status: DC

## 2020-11-20 NOTE — Telephone Encounter (Signed)
Outpatient Medication Detail   Disp Refills Start End   carvedilol (COREG) 3.125 MG tablet 180 tablet 0 11/20/2020    Sig: TAKE 1 TABLET BY MOUTH TWICE DAILY WITH A MEAL   Sent to pharmacy as: carvedilol (COREG) 3.125 MG tablet   E-Prescribing Status: Receipt confirmed by pharmacy (11/20/2020  3:09 PM EST)     Pharmacy  [id number] - [place], [place] - [address]

## 2020-11-30 ENCOUNTER — Telehealth: Payer: Self-pay | Admitting: Cardiology

## 2020-11-30 NOTE — Telephone Encounter (Signed)
Patient is calling stating that he would like Dr. Anne Fu advice on a prescription for Erectile Dysfunction. Please advise.

## 2020-11-30 NOTE — Telephone Encounter (Signed)
Pt last seen here (televisit) by Dr Anne Fu 07/27/2019.  He was ordered to have CBC, CMP and Lipid panel which has not be completed.  He was also instructed to f/u in 1 yr with Dr Anne Fu (due 06/2020) which also has not occurred.  Will forward this information to Dr Anne Fu for his review and recommendations.

## 2020-11-30 NOTE — Telephone Encounter (Signed)
Dr Anne Fu reviewed this information and requested pt schedule an appt for eval and discussion as pt was last since 06/2019.  Pt had also sent a MyChart Advice Request.  This information was sent to him via MyChart.

## 2021-01-03 ENCOUNTER — Encounter: Payer: Self-pay | Admitting: Physician Assistant

## 2021-01-03 ENCOUNTER — Ambulatory Visit (INDEPENDENT_AMBULATORY_CARE_PROVIDER_SITE_OTHER): Payer: Commercial Managed Care - PPO | Admitting: Physician Assistant

## 2021-01-03 ENCOUNTER — Other Ambulatory Visit: Payer: Self-pay

## 2021-01-03 ENCOUNTER — Other Ambulatory Visit (HOSPITAL_COMMUNITY): Payer: Commercial Managed Care - PPO

## 2021-01-03 VITALS — BP 122/70 | HR 166 | Ht 69.0 in | Wt 245.6 lb

## 2021-01-03 DIAGNOSIS — I2510 Atherosclerotic heart disease of native coronary artery without angina pectoris: Secondary | ICD-10-CM | POA: Diagnosis not present

## 2021-01-03 DIAGNOSIS — I10 Essential (primary) hypertension: Secondary | ICD-10-CM

## 2021-01-03 DIAGNOSIS — I4892 Unspecified atrial flutter: Secondary | ICD-10-CM

## 2021-01-03 DIAGNOSIS — E782 Mixed hyperlipidemia: Secondary | ICD-10-CM

## 2021-01-03 DIAGNOSIS — Z720 Tobacco use: Secondary | ICD-10-CM

## 2021-01-03 DIAGNOSIS — N529 Male erectile dysfunction, unspecified: Secondary | ICD-10-CM

## 2021-01-03 MED ORDER — CARVEDILOL 6.25 MG PO TABS: ORAL_TABLET | ORAL | 3 refills | Status: AC

## 2021-01-03 MED ORDER — APIXABAN 5 MG PO TABS: 5.0000 mg | ORAL_TABLET | Freq: Two times a day (BID) | ORAL | 3 refills | Status: AC

## 2021-01-03 NOTE — Patient Instructions (Signed)
Medication Instructions:  Your physician has recommended you make the following change in your medication:   1. Start Eliquis one tablet by mouth ( 5 mg) twice daily, start tonight with one tablet.   2.  Increase Coreg one tablet by mouth ( 6.25 mg) twice daily  *If you need a refill on your cardiac medications before your next appointment, please call your pharmacy*   Lab Work: Your physician recommends that you have lab work today: CBC/BMET  If you have labs (blood work) drawn today and your tests are completely normal, you will receive your results only by: Marland Kitchen MyChart Message (if you have MyChart) OR . A paper copy in the mail If you have any lab test that is abnormal or we need to change your treatment, we will call you to review the results.   Testing/Procedures: Your physician has recommended that you have a Cardioversion (DCCV). Electrical Cardioversion uses a jolt of electricity to your heart either through paddles or wired patches attached to your chest. This is a controlled, usually prescheduled, procedure. Defibrillation is done under light anesthesia in the hospital, and you usually go home the day of the procedure. This is done to get your heart back into a normal rhythm. You are not awake for the procedure. Please see the instruction sheet below.  Your provider has recommended a cardioversion.   You will need COVID-19 testing prior to your procedure. Go to Pre-Procedural COVID-19 Testing Site at [facility] in [place], [place] [address] at _________________________________________________.  Thursday, March 10 @ 8:20 am  You are scheduled for a cardioversion on Friday, March 11 at 12:00 with Dr. Cristal Deer or associates. Please go to [facility] ([address]) 2nd Floor Short Stay at 11:00 am.  There is free valet parking available.  Enter through the [employer] Do not have any food or drink after midnight on Thursday, March 10.  You may take  your medicines with a sip of water on the day of your procedure.   Hold the following medicines None  DO NOT STOP YOUR ANTICOAGULANT (BLOOD THINNER) Eliquis - YOU WILL NEED TO CONTINUE YOUR ANTICOAGULANT AFTER YOUR PROCEDURE UNTIL YOU ARE TOLD BY YOUR PROVIDER IT IS SAFE TO STOP  You will need someone to drive you home following your procedure and stay in the waiting room during your procedure. Failure to do so could result in your procedure being cancelled.   Every effort is made to have your procedure done on time. Occasionally there are emergencies that occur at the hospital that may cause delays.   Call the [facility] office at [phone] if you have any questions, problems or concerns.     Electrical Cardioversion Electrical cardioversion is the delivery of a jolt of electricity to change the rhythm of the heart. Sticky patches or metal paddles are placed on the chest to deliver the electricity from a device. This is done to restore a normal rhythm. A rhythm that is too fast or not regular keeps the heart from pumping well. Electrical cardioversion is done in an emergency if:   There is low or no blood pressure as a result of the heart rhythm.    Normal rhythm must be restored as fast as possible to protect the brain and heart from further damage.    It may save a life. Cardioversion may be done for heart rhythms that are not immediately life threatening, such as atrial fibrillation or  flutter, in which:   The heart is beating too fast or is not regular.    Medicine to change the rhythm has not worked.    It is safe to wait in order to allow time for preparation.  Symptoms of the abnormal rhythm are bothersome.  The risk of stroke and other serious problems can be reduced.  LET [facility] CARE PROVIDER KNOW ABOUT:   Any allergies you have.  All medicines you are taking, including vitamins, herbs, eye drops, creams, and over-the-counter  medicines.  Previous problems you or members of your family have had with the use of anesthetics.    Any blood disorders you have.    Previous surgeries you have had.    Medical conditions you have.   RISKS AND COMPLICATIONS  Generally, this is a safe procedure. However, problems can occur and include:   Breathing problems related to the anesthetic used.  A blood clot that breaks free and travels to other parts of your body. This could cause a stroke or other problems. The risk of this is lowered by use of blood-thinning medicine (anticoagulant) prior to the procedure.  Cardiac arrest (rare).   BEFORE THE PROCEDURE   You may have tests to detect blood clots in your heart and to evaluate heart function.   You may start taking anticoagulants so your blood does not clot as easily.    Medicines may be given to help stabilize your heart rate and rhythm.   PROCEDURE  You will be given medicine through an IV tube to reduce discomfort and make you sleepy (sedative).    An electrical shock will be delivered.   AFTER THE PROCEDURE Your heart rhythm will be watched to make sure it does not change. You will need someone to drive you home.      Follow-Up: At [facility], you and your health needs are our priority.  As part of our continuing mission to provide you with exceptional heart care, we have created designated Provider Care Teams.  These Care Teams include your primary Cardiologist (physician) and Advanced Practice Providers (APPs -  Physician Assistants and Nurse Practitioners) who all work together to provide you with the care you need, when you need it.  We recommend signing up for the patient portal called "MyChart".  Sign up information is provided on this After Visit Summary.  MyChart is used to connect with patients for Virtual Visits (Telemedicine).  Patients are able to view lab/test results, encounter notes, upcoming appointments, etc.  Non-urgent messages can be  sent to your provider as well.   To learn more about what you can do with MyChart, go to [email or web address].    Your next appointment:   Your physician recommends that you keep your scheduled  follow-up appointment with Tereso Newcomer, PA You have been referred to EP with Dr. Elberta Fortis     Other Instructions

## 2021-01-03 NOTE — Progress Notes (Addendum)
Cardiology Office Note:    Date:  01/03/2021   ID:  Gabriel Green, DOB 04-Feb-1961, MRN [id number]  PCP:  Patient, No Pcp Per   [facility]  Cardiologist:  Donato Schultz, MD   Electrophysiologist:  None       Referring MD: No ref. provider found   Chief Complaint:  Follow-up (CAD; discuss ED meds)    Patient Profile:    Gabriel Green is a 60 y.o. male with:   Coronary artery disease   NSTEMI s/p CABG in 10/2018 (L-LAD, S-Dx, S-OM1, S-PDA)  Initial presentation to [facility] in [place], [place] >> CABG recommended but pt left AMA to come home to [facility] for the procedure.  Hypertension   Hyperlipidemia  Borderline diabetes mellitus   Tobacco use  Hx of ETOH abuse   Prior CV studies: Carotid US 11/17/2018 Bilateral ICA 1-39   Studies done @ [facility] [place] in [place]  Cardiac catheterization 11/12/2018 Significant multivessel coronary artery disease as described above  including an 80% stenotic lesion in the proximal LAD, 80% stenotic lesion  in the proximal left circumflex, and 90% stenotic lesion in the mid RCA   Carotid US 11/11/2018 No evidence of hemodynamically significant carotid stenosis by criteria similar to NASCET. Less than 50% stenosis of bilateral internal carotid arteries.   Echocardiogram 11/10/2018 LV cavity appears normal, GLS -15.4%, normal RVSF, trace MR, trace TR  History of Present Illness:    Mr. Gabriel Green was last seen by Dr. Anne Green in 06/2019 via telemedicine.  He returns for follow-up.  He called in recently to request medication for Rx of erectile dysfunction  Dr. Anne Green requested he f/u as it had been > 1 year since being seen.  He is here alone.  His ECG today suggests AFlutter with RVR.  His HR is 166.  He has no symptoms with this.  He has not been having chest pain or significant shortness of breath.  He has not had syncope, orthopnea, leg edema.        Past Medical History:   Diagnosis Date  . Alcohol use   . Borderline diabetes    a. 10/2018 HbA1c = 6.3.  . CAD (coronary artery disease)    a. 11/10/2018 NSTEMI/Cath [facility], [place]): LM 70ost (dampening), LAD 70-80p, D1 20-30, LCX 70-80p, RCA 33m w/ L->R collats, RPDA/RPL min irregs.  Marland Kitchen Dyspnea    with walking  . History of echocardiogram    a. 11/10/2017 Echo: Nl LV fxn. Triv MR/TR.  Marland Kitchen History of kidney stones   . Hypertension   . Morbid obesity (HCC)   . Tobacco abuse     Current Medications: Current Meds  Medication Sig  . apixaban (ELIQUIS) 5 MG TABS tablet Take 1 tablet (5 mg total) by mouth 2 (two) times daily.  Marland Kitchen aspirin EC 81 MG EC tablet Take 1 tablet (81 mg total) by mouth daily.  Marland Kitchen atorvastatin (LIPITOR) 80 MG tablet Take 1 tablet (80 mg total) by mouth daily at 6 PM.  . nitroGLYCERIN (NITROSTAT) 0.4 MG SL tablet Place 0.4 mg under the tongue as needed.  . [DISCONTINUED] carvedilol (COREG) 3.125 MG tablet TAKE 1 TABLET BY MOUTH TWICE DAILY WITH A MEAL     Allergies:   Patient has no known allergies.   Social History   Tobacco Use  . Smoking status: Current Every Day Smoker    Packs/day: 1.00    Years: 40.00    Pack years: 40.00  Types: Cigarettes  . Smokeless tobacco: Never Used  . Tobacco comment: pt.stated "will be going on the patch"  Vaping Use  . Vaping Use: Never used  Substance Use Topics  . Alcohol use: Yes    Comment: at least a 12 pack of beer/wk.  . Drug use: No     Family Hx: The patient's family history includes Alzheimer's disease in his mother; High blood pressure in his father; Other in his brother and brother; Thyroid disease in his sister.  ROS   EKGs/Labs/Other Test Reviewed:    EKG:  EKG is   ordered today.  The ekg ordered today demonstrates probable atypical AFlutter with RVR, HR 166  Recent Labs: No results found for requested labs within last 8760 hours.   Recent Lipid Panel No results found for: CHOL, TRIG, HDL, CHOLHDL, LDLCALC,  LDLDIRECT    Risk Assessment/Calculations:    CHA2DS2-VASc Score = 2  This indicates a 2.2% annual risk of stroke. The patient's score is based upon: CHF History: No HTN History: Yes Diabetes History: No Stroke History: No Vascular Disease History: Yes Age Score: 0 Gender Score: 0     Physical Exam:    VS:  BP 122/70   Pulse (!) 166   Ht 5\' 9"  (1.753 m)   Wt 245 lb 9.6 oz (111.4 kg)   SpO2 97%   BMI 36.27 kg/m     Wt Readings from Last 3 Encounters:  01/03/21 245 lb 9.6 oz (111.4 kg)  07/27/19 238 lb (108 kg)  02/10/19 227 lb (103 kg)     Constitutional:      Appearance: Healthy appearance. Not in distress.  Neck:     Vascular: No JVR. JVD normal.  Pulmonary:     Effort: Pulmonary effort is normal.     Breath sounds: No wheezing. No rales.  Cardiovascular:     Tachycardia present. Regular rhythm. Normal S1. Normal S2.     Murmurs: There is no murmur.  Edema:    Peripheral edema absent.  Abdominal:     Palpations: Abdomen is soft.  Skin:    General: Skin is warm and dry.  Neurological:     General: No focal deficit present.     Mental Status: Alert and oriented to person, place and time.     Cranial Nerves: Cranial nerves are intact.      ASSESSMENT & PLAN:    1. Atrial flutter with rapid ventricular response (HCC) He is asymptomatic.  His HR is 166.  CHA2DS2-VASc Score = 2 [CHF History: No, HTN History: Yes, Diabetes History: No, Stroke History: No, Vascular Disease History: Yes, Age Score: 0, Gender Score: 0].  Therefore, the patient's annual risk of stroke is 2.2 %.  He will need to be started on anticoagulation.  We will need to arrange a TEE DCCV to restore NSR.  We will refer to EP once he is back in NSR.  As he is hemodynamically stable, I think his DCCV can be done as an OP.  I have asked him to come to the ED if he starts to have symptoms of shortness of breath, chest pain, dizziness, etc.    -Increase Carvedilol 6.25 mg twice daily   -Start  Apixaban 5 mg twice daily   -Arrange TEE-DCCV later this week   -F/u post DCCV  -Refer to EP for AFlutter   2. Coronary artery disease involving native coronary artery of native heart without angina pectoris S/p NSTEMI in 10/2018 followed by CABG.  He is not having angina despite a HR of 166.  Continue current dose of ASA, statin.  Increase beta-blocker dose as noted.    3. Essential hypertension The patient's blood pressure is controlled on his current regimen.  Continue current therapy.    4. Mixed hyperlipidemia Continue high intensity statin Rx.  Will need to arrange fasting labs for Lipids at f/u after DCCV.   5. Tobacco use We discussed the importance of quitting smoking.    6. Erectile dysfunction This was the main reason why he scheduled an OV today.  He can likely take PDE-5 inhibitors.  However, given his current HR and need for urgent DCCV, initiation of anticoagulation, etc, I think it is best to hold off on starting one of these drugs for now.  This can be discussed at f/u.      Shared Decision Making/Informed Consent The risks [stroke, cardiac arrhythmias rarely resulting in the need for a temporary or permanent pacemaker, skin irritation or burns, esophageal damage, perforation (1:10,000 risk), bleeding, pharyngeal hematoma as well as other potential complications associated with conscious sedation including aspiration, arrhythmia, respiratory failure and death], benefits (treatment guidance, restoration of normal sinus rhythm, diagnostic support) and alternatives of a transesophageal echocardiogram guided cardioversion were discussed in detail with Gabriel Green and he is willing to proceed.    Dispo:  Return in 2 weeks (on 01/17/2021) for Post Procedure Follow Up.   Medication Adjustments/Labs and Tests Ordered: Current medicines are reviewed at length with the patient today.  Concerns regarding medicines are outlined above.  Tests Ordered: Orders Placed This Encounter   Procedures  . Basic Metabolic Panel (BMET)  . CBC  . Ambulatory referral to Cardiac Electrophysiology  . EKG 12-Lead   Medication Changes: Meds ordered this encounter  Medications  . apixaban (ELIQUIS) 5 MG TABS tablet    Sig: Take 1 tablet (5 mg total) by mouth 2 (two) times daily.    Dispense:  120 tablet    Refill:  3  . carvedilol (COREG) 6.25 MG tablet    Sig: TAKE 1 TABLET BY MOUTH TWICE DAILY WITH A MEAL    Dispense:  180 tablet    Refill:  3    Signed, Tereso Newcomer, PA-C  01/03/2021 4:31 PM    [facility] [address] [place], [place], [place]  [address] Phone: [phone]; Fax: [phone]

## 2021-01-04 ENCOUNTER — Other Ambulatory Visit (HOSPITAL_COMMUNITY): Payer: Commercial Managed Care - PPO

## 2021-01-04 ENCOUNTER — Encounter (HOSPITAL_COMMUNITY): Payer: Self-pay | Admitting: Certified Registered Nurse Anesthetist

## 2021-01-04 ENCOUNTER — Other Ambulatory Visit (HOSPITAL_COMMUNITY)
Admission: RE | Admit: 2021-01-04 | Discharge: 2021-01-04 | Disposition: A | Discharge status: Home / Self Care | Payer: Commercial Managed Care - PPO | Source: Ambulatory Visit | Attending: Cardiology | Admitting: Cardiology

## 2021-01-04 ENCOUNTER — Telehealth: Payer: Self-pay | Admitting: *Deleted

## 2021-01-04 ENCOUNTER — Telehealth: Payer: Self-pay | Admitting: Physician Assistant

## 2021-01-04 DIAGNOSIS — Z20822 Contact with and (suspected) exposure to covid-19: Secondary | ICD-10-CM | POA: Insufficient documentation

## 2021-01-04 DIAGNOSIS — Z01812 Encounter for preprocedural laboratory examination: Secondary | ICD-10-CM | POA: Insufficient documentation

## 2021-01-04 LAB — BASIC METABOLIC PANEL
BUN/Creatinine Ratio: 8 — ABNORMAL LOW (ref 9–20)
BUN: 11 mg/dL (ref 6–24)
CO2: 18 mmol/L — ABNORMAL LOW (ref 20–29)
Calcium: 8.6 mg/dL — ABNORMAL LOW (ref 8.7–10.2)
Chloride: 107 mmol/L — ABNORMAL HIGH (ref 96–106)
Creatinine, Ser: 1.43 mg/dL — ABNORMAL HIGH (ref 0.76–1.27)
Glucose: 114 mg/dL — ABNORMAL HIGH (ref 65–99)
Potassium: 4.4 mmol/L (ref 3.5–5.2)
Sodium: 140 mmol/L (ref 134–144)
eGFR: 57 mL/min/{1.73_m2} — ABNORMAL LOW (ref >59)

## 2021-01-04 LAB — CBC
Hematocrit: 50.4 % (ref 37.5–51.0)
Hemoglobin: 17 g/dL (ref 13.0–17.7)
MCH: 33 pg (ref 26.6–33.0)
MCHC: 33.7 g/dL (ref 31.5–35.7)
MCV: 98 fL — ABNORMAL HIGH (ref 79–97)
Platelets: 279 10*3/uL (ref 150–450)
RBC: 5.15 x10E6/uL (ref 4.14–5.80)
RDW: 12.8 % (ref 11.6–15.4)
WBC: 21.3 10*3/uL (ref 3.4–10.8)

## 2021-01-04 LAB — SARS CORONAVIRUS 2 (TAT 6-24 HRS): SARS Coronavirus 2: NEGATIVE

## 2021-01-04 NOTE — Telephone Encounter (Signed)
Follow Up:3       Wife is returning call from Garner Gavel., concerning pt's lab results.

## 2021-01-04 NOTE — Telephone Encounter (Signed)
Spoke with pt and advised per Tereso Newcomer, PA-C pt will need to go to ED for further evaluation based on recent labs. Reviewed Natale Lay message as below with pt.  Pt verbalized understanding and states he will go to ED as recommended.  WBC very high. Creatinine elevated which is new.  Underlying condition may be cause of rapid AFlutter. Discussed with his primary cardiologist (Dr. Anne Fu). We agree he soul be evaluated in the ED. I left a message on his mobile phone to call us back. No answer on home phone. I will relay message on MyChart results that we recommend he go to the ED. PLAN:  1. Please send to the ED. Pt should be admitted by internal medicine to workup leukocytosis, acute kidney injury. Cardiology will follow for AFlutter. 2. DO NOT cancel TEE-DCCV. We will proceed as planned tomorrow unless something changes.  Tereso Newcomer, PA-C   01/04/2021 9:08 AM   Mr. Bowermaster  Your white blood cell count is very high. Your kidney function number (creatinine) is high indicating that your kidney function is reduced. I have discussed this with Dr. Anne Fu today and we agree that you should go to the emergency room for evaluation. I have left a message on your cell phone as well. We will still plan on the cardioversion procedure tomorrow as scheduled. But, we need to find out why your white blood cell count is so high and your kidneys are showing signs of injury. I suspect you may have an infection and that would likely explain why your heart is out of rhythm and your heart rate is so high. Call [phone] to speak with [place] when you get this message.

## 2021-01-04 NOTE — Telephone Encounter (Signed)
lvm on cell phone to see if pt was headed to the hospital and than called home phone no answer or vm.

## 2021-01-05 ENCOUNTER — Encounter: Payer: Self-pay | Admitting: Physician Assistant

## 2021-01-05 ENCOUNTER — Encounter (HOSPITAL_COMMUNITY): Admission: RE | Payer: Self-pay | Source: Home / Self Care

## 2021-01-05 ENCOUNTER — Ambulatory Visit (HOSPITAL_COMMUNITY)
Admission: RE | Admit: 2021-01-05 | Payer: Commercial Managed Care - PPO | Source: Home / Self Care | Admitting: Cardiology

## 2021-01-05 ENCOUNTER — Ambulatory Visit (HOSPITAL_COMMUNITY): Payer: Commercial Managed Care - PPO

## 2021-01-05 SURGERY — ECHOCARDIOGRAM, TRANSESOPHAGEAL
Anesthesia: Monitor Anesthesia Care

## 2021-01-10 ENCOUNTER — Ambulatory Visit (HOSPITAL_COMMUNITY): Admit: 2021-01-10 | Payer: Commercial Managed Care - PPO | Admitting: Cardiology

## 2021-01-10 ENCOUNTER — Encounter (HOSPITAL_COMMUNITY): Payer: Self-pay

## 2021-01-10 SURGERY — ECHOCARDIOGRAM, TRANSESOPHAGEAL
Anesthesia: General

## 2021-01-25 NOTE — Progress Notes (Deleted)
Cardiology Office Note:    Date:  01/25/2021   ID:  Gabriel Green, DOB May 11, 1961, MRN [id number]  PCP:  Patient, No Pcp Per (Inactive)   [facility]  Cardiologist:  Donato Schultz, MD *** Advanced Practice Provider:  No care team member to display Electrophysiologist:  None  {Press F2 to show EP APP, CHF, sleep or structural heart MD               :[id number]}  { Click here to update then REFRESH NOTE - MD (PCP) or APP (Team Member)  Change PCP Type for MD, Specialty for APP is either Cardiology or Clinical Cardiac Electrophysiology  :[id number]}   Referring MD: No ref. provider found   Chief Complaint:  No chief complaint on file.    Patient Profile:   {Link to SnapShot    :1}  Gabriel Green is a 60 y.o. male with:   Coronary artery disease  ? NSTEMI s/p CABG in 10/2018 (L-LAD, S-Dx, S-OM1, S-PDA)  Initial presentation to [facility] in [place], [place] >> CABG recommended but pt left AMA to come home to [facility] for the procedure.  Hypertension   Hyperlipidemia  Borderline diabetes mellitus   Tobacco use  Hx of ETOH abuse   Prior CV studies: Carotid US 11/17/2018 Bilateral ICA 1-39  Studies done @ [facility] [place] in [place]  Cardiac catheterization 11/12/2018 Significant multivessel coronary artery disease as described above  including an 80% stenotic lesion in the proximal LAD, 80% stenotic lesion  in the proximal left circumflex, and 90% stenotic lesion in the mid RCA   Carotid US 11/11/2018 No evidence of hemodynamically significant carotid stenosis by criteria similar to NASCET. Less than 50% stenosis of bilateral internal carotid arteries.   Echocardiogram 11/10/2018 LV cavity appears normal, GLS -15.4%, normal RVSF, trace MR, trace TR    History of Present Illness:    [name] was last seen 01/03/21.  He was in AFlutter w RVR.  DCCV was arranged.  Labs demonstrated elevated creatinine and  markedly elevated WBC with findings suggestive of metabolic acidosis.  He was instructed to go to the ED but did not go and also did not show up for his DCCV in the hospital.  He returns for f/u.  ***         Past Medical History:  Diagnosis Date  . Alcohol use   . Borderline diabetes    a. 10/2018 HbA1c = 6.3.  . CAD (coronary artery disease)    a. 11/10/2018 NSTEMI/Cath [facility], [place]): LM 70ost (dampening), LAD 70-80p, D1 20-30, LCX 70-80p, RCA 72m w/ L->R collats, RPDA/RPL min irregs.  Marland Kitchen Dyspnea    with walking  . History of echocardiogram    a. 11/10/2017 Echo: Nl LV fxn. Triv MR/TR.  Marland Kitchen History of kidney stones   . Hypertension   . Morbid obesity (HCC)   . Tobacco abuse     Current Medications: No outpatient medications have been marked as taking for the 01/26/21 encounter (Appointment) with Tereso Newcomer T, PA-C.     Allergies:   Patient has no known allergies.   Social History   Tobacco Use  . Smoking status: Current Every Day Smoker    Packs/day: 1.00    Years: 40.00    Pack years: 40.00    Types: Cigarettes  . Smokeless tobacco: Never Used  . Tobacco comment: pt.stated "will be going on the patch"  Vaping Use  . Vaping Use:  Never used  Substance Use Topics  . Alcohol use: Yes    Comment: at least a 12 pack of beer/wk.  . Drug use: No     Family Hx: The patient's family history includes Alzheimer's disease in his mother; High blood pressure in his father; Other in his brother and brother; Thyroid disease in his sister.  ROS   EKGs/Labs/Other Test Reviewed:    EKG:  EKG is *** ordered today.  The ekg ordered today demonstrates ***  Recent Labs: 01/03/2021: BUN 11; Creatinine, Ser 1.43; Hemoglobin 17.0; Platelets 279; Potassium 4.4; Sodium 140   Recent Lipid Panel No results found for: CHOL, TRIG, HDL, CHOLHDL, LDLCALC, LDLDIRECT    Risk Assessment/Calculations:   {Does this patient have ATRIAL FIBRILLATION?[phone]}  Physical Exam:     VS:  There were no vitals taken for this visit.    Wt Readings from Last 3 Encounters:  01/03/21 245 lb 9.6 oz (111.4 kg)  07/27/19 238 lb (108 kg)  02/10/19 227 lb (103 kg)     Physical Exam ***     ASSESSMENT & PLAN:    *** 1. Atrial flutter with rapid ventricular response (HCC) He is asymptomatic.  His HR is 166.  CHA2DS2-VASc Score = 2 [CHF History: No, HTN History: Yes, Diabetes History: No, Stroke History: No, Vascular Disease History: Yes, Age Score: 0, Gender Score: 0].  Therefore, the patient's annual risk of stroke is 2.2 %.  He will need to be started on anticoagulation.  We will need to arrange a TEE DCCV to restore NSR.  We will refer to EP once he is back in NSR.  As he is hemodynamically stable, I think his DCCV can be done as an OP.  I have asked him to come to the ED if he starts to have symptoms of shortness of breath, chest pain, dizziness, etc.               -Increase Carvedilol 6.25 mg twice daily              -Start Apixaban 5 mg twice daily              -Arrange TEE-DCCV later this week              -F/u post DCCV             -Refer to EP for AFlutter   2. Coronary artery disease involving native coronary artery of native heart without angina pectoris S/p NSTEMI in 10/2018 followed by CABG.  He is not having angina despite a HR of 166.  Continue current dose of ASA, statin.  Increase beta-blocker dose as noted.    3. Essential hypertension The patient's blood pressure is controlled on his current regimen.  Continue current therapy.    4. Mixed hyperlipidemia Continue high intensity statin Rx.  Will need to arrange fasting labs for Lipids at f/u after DCCV.   5. Tobacco use We discussed the importance of quitting smoking.    6. Erectile dysfunction This was the main reason why he scheduled an OV today.  He can likely take PDE-5 inhibitors.  However, given his current HR and need for urgent DCCV, initiation of anticoagulation, etc, I think it is best to  hold off on starting one of these drugs for now.  This can be discussed at f/u.   {Are you ordering a CV Procedure (e.g. stress test, cath, DCCV, TEE, etc)?   Press F2        :  [id number]}    Dispo:  No follow-ups on file.   Medication Adjustments/Labs and Tests Ordered: Current medicines are reviewed at length with the patient today.  Concerns regarding medicines are outlined above.  Tests Ordered: No orders of the defined types were placed in this encounter.  Medication Changes: No orders of the defined types were placed in this encounter.   Signed, Tereso Newcomer, PA-C  01/25/2021 9:56 PM    [facility] [address] [place], [place], [place]  [address] Phone: [phone]; Fax: [phone]

## 2021-01-26 ENCOUNTER — Ambulatory Visit: Payer: Commercial Managed Care - PPO | Admitting: Physician Assistant

## 2021-01-26 ENCOUNTER — Telehealth: Payer: Self-pay

## 2021-01-26 DIAGNOSIS — D72829 Elevated white blood cell count, unspecified: Secondary | ICD-10-CM

## 2021-01-26 DIAGNOSIS — I4892 Unspecified atrial flutter: Secondary | ICD-10-CM

## 2021-01-26 DIAGNOSIS — I10 Essential (primary) hypertension: Secondary | ICD-10-CM

## 2021-01-26 DIAGNOSIS — I2510 Atherosclerotic heart disease of native coronary artery without angina pectoris: Secondary | ICD-10-CM

## 2021-01-26 DIAGNOSIS — N179 Acute kidney failure, unspecified: Secondary | ICD-10-CM

## 2021-01-26 DIAGNOSIS — E782 Mixed hyperlipidemia: Secondary | ICD-10-CM

## 2021-01-26 NOTE — Telephone Encounter (Signed)
Left message for patient to call back  

## 2021-01-26 NOTE — Telephone Encounter (Signed)
-----   Message from Beatrice Lecher, [place] sent at 01/26/2021  9:47 AM EDT ----- Pt did not go to ED as instructed on 01/03/21. Pt did not show for cardioversion at [facility] on 01/05/21. Pt did not show for f/u appt 01/26/2021. Tereso Newcomer, PA-C    01/26/2021 9:47 AM

## 2021-01-29 NOTE — Telephone Encounter (Signed)
Left message for the pt to call back.   Pt is still sched for post cardioversion follow up with Dr. Elberta Fortis 02/05/21.

## 2021-01-31 NOTE — Telephone Encounter (Signed)
[place] -- please try and reach pt to see if he is still planning on coming to consult appt (see below documentation) Thanks

## 2021-02-05 ENCOUNTER — Institutional Professional Consult (permissible substitution): Payer: Commercial Managed Care - PPO | Admitting: Cardiology

## 2021-02-05 NOTE — Progress Notes (Deleted)
Electrophysiology Office Note   Date:  02/05/2021   ID:  Gabriel Green, DOB Nov 24, 1960, MRN [id number]  PCP:  Patient, No Pcp Per (Inactive)  Cardiologist:  Anne Fu Primary Electrophysiologist:  Aviraj Kentner Jorja Loa, MD    Chief Complaint: ***   History of Present Illness: Gabriel Green is a 60 y.o. male who is being seen today for the evaluation of *** at the request of Tereso Newcomer T, PA-C. Presenting today for electrophysiology evaluation.  He has a history significant for coronary artery disease status post non-STEMI and CABG x4 in 2020, hypertension, hyperlipidemia, borderline diabetes, alcohol, and tobacco abuse.  He presented to cardiology clinic 01/03/2021 in atrial flutter.  He was asymptomatic.  His heart rate was 166 bpm.  Today, he denies*** symptoms of palpitations, chest pain, shortness of breath, orthopnea, PND, lower extremity edema, claudication, dizziness, presyncope, syncope, bleeding, or neurologic sequela. The patient is tolerating medications without difficulties.    Past Medical History:  Diagnosis Date  . Alcohol use   . Borderline diabetes    a. 10/2018 HbA1c = 6.3.  . CAD (coronary artery disease)    a. 11/10/2018 NSTEMI/Cath [facility], [place]): LM 70ost (dampening), LAD 70-80p, D1 20-30, LCX 70-80p, RCA 63m w/ L->R collats, RPDA/RPL min irregs.  Marland Kitchen Dyspnea    with walking  . History of echocardiogram    a. 11/10/2017 Echo: Nl LV fxn. Triv MR/TR.  Marland Kitchen History of kidney stones   . Hypertension   . Morbid obesity (HCC)   . Tobacco abuse    Past Surgical History:  Procedure Laterality Date  . CORONARY ARTERY BYPASS GRAFT N/A 11/26/2018   Procedure: CORONARY ARTERY BYPASS GRAFTING (CABG) times three;  Surgeon: Alleen Borne, MD;  Location: [facility] OR;  Service: Open Heart Surgery;  Laterality: N/A;  . ENDOVEIN HARVEST OF GREATER SAPHENOUS VEIN Right 11/26/2018   Procedure: Mack Guise Of Greater Saphenous Vein;  Surgeon: Alleen Borne, MD;   Location: [facility] OR;  Service: Open Heart Surgery;  Laterality: Right;  . TEE WITHOUT CARDIOVERSION N/A 11/26/2018   Procedure: TRANSESOPHAGEAL ECHOCARDIOGRAM (TEE);  Surgeon: Alleen Borne, MD;  Location: [facility] OR;  Service: Open Heart Surgery;  Laterality: N/A;  . TONSILLECTOMY       Current Outpatient Medications  Medication Sig Dispense Refill  . apixaban (ELIQUIS) 5 MG TABS tablet Take 1 tablet (5 mg total) by mouth 2 (two) times daily. 120 tablet 3  . aspirin EC 81 MG EC tablet Take 1 tablet (81 mg total) by mouth daily.    Marland Kitchen atorvastatin (LIPITOR) 80 MG tablet Take 1 tablet (80 mg total) by mouth daily at 6 PM. 90 tablet 3  . carvedilol (COREG) 6.25 MG tablet TAKE 1 TABLET BY MOUTH TWICE DAILY WITH A MEAL 180 tablet 3  . nitroGLYCERIN (NITROSTAT) 0.4 MG SL tablet Place 0.4 mg under the tongue as needed.     No current facility-administered medications for this visit.    Allergies:   Patient has no known allergies.   Social History:  The patient  reports that he has been smoking cigarettes. He has a 40.00 pack-year smoking history. He has never used smokeless tobacco. He reports current alcohol use. He reports that he does not use drugs.   Family History:  The patient's family history includes Alzheimer's disease in his mother; High blood pressure in his father; Other in his brother and brother; Thyroid disease in his sister.    ROS:  Please see the history of present  illness.   Otherwise, review of systems is positive for none.   All other systems are reviewed and negative.    PHYSICAL EXAM: VS:  There were no vitals taken for this visit. , BMI There is no height or weight on file to calculate BMI. GEN: Well nourished, well developed, in no acute distress  HEENT: normal  Neck: no JVD, carotid bruits, or masses Cardiac: ***RRR; no murmurs, rubs, or gallops,no edema  Respiratory:  clear to auscultation bilaterally, normal work of breathing GI: soft, nontender, nondistended, +  BS MS: no deformity or atrophy  Skin: warm and dry Neuro:  Strength and sensation are intact Psych: euthymic mood, full affect  EKG:  EKG {ACTION; IS/IS ELF:[id number]} ordered today. Personal review of the ekg ordered *** shows ***  Recent Labs: 01/03/2021: BUN 11; Creatinine, Ser 1.43; Hemoglobin 17.0; Platelets 279; Potassium 4.4; Sodium 140    Lipid Panel  No results found for: CHOL, TRIG, HDL, CHOLHDL, VLDL, LDLCALC, LDLDIRECT   Wt Readings from Last 3 Encounters:  01/03/21 245 lb 9.6 oz (111.4 kg)  07/27/19 238 lb (108 kg)  02/10/19 227 lb (103 kg)      Other studies Reviewed: Additional studies/ records that were reviewed today include: Cardiac catheterization 11/12/2018 Significant multivessel coronary artery disease as described above  including an 80% stenotic lesion in the proximal LAD, 80% stenotic lesion  in the proximal left circumflex, and 90% stenotic lesion in the mid RCA   Carotid US 11/11/2018 No evidence of hemodynamically significant carotid stenosis by criteria similar to NASCET. Less than 50% stenosis of bilateral internal carotid arteries.   Echocardiogram 11/10/2018 LV cavity appears normal, GLS -15.4%, normal RVSF, trace MR, trace TR   ASSESSMENT AND PLAN:  1.  Atrial flutter with rapid ventricular response: Patient was asymptomatic.  Heart rate 166 bpm.  He had a TEE and cardioversion.  On Eliquis and carvedilol.  CHA2DS2-VASc of 2.  2.  Coronary artery disease: Status post CABG.  No current chest pain.  3.  Hypertension:***  4.  Hyperlipidemia:***   Current medicines are reviewed at length with the patient today.   The patient {ACTIONS; HAS/DOES NOT HAVE:19233} concerns regarding his medicines.  The following changes were made today:  {NONE DEFAULTED:18576::"none"}  Labs/ tests ordered today include: *** No orders of the defined types were placed in this encounter.    Disposition:   FU with Stevie Charter {gen number [id number]} {Days to  years:10300}  Signed, Wen Merced Jorja Loa, MD  02/05/2021 8:24 AM     [facility] [address] [place] [address] [phone] (office) [phone] (fax)

## 2021-02-15 ENCOUNTER — Encounter: Payer: Self-pay | Admitting: Cardiology
